# Patient Record
Sex: Male | Born: 1980
Health system: Southern US, Community
[De-identification: ages and names within clinical notes are randomized; demographics above are authoritative.]

## PROBLEM LIST (undated history)

## (undated) DIAGNOSIS — F32A Depression, unspecified: Secondary | ICD-10-CM

## (undated) DIAGNOSIS — D571 Sickle-cell disease without crisis: Secondary | ICD-10-CM

## (undated) DIAGNOSIS — D573 Sickle-cell trait: Secondary | ICD-10-CM

## (undated) DIAGNOSIS — R161 Splenomegaly, not elsewhere classified: Secondary | ICD-10-CM

## (undated) HISTORY — DX: Splenomegaly, not elsewhere classified: R16.1

## (undated) HISTORY — DX: Sickle-cell trait: D57.3

## (undated) HISTORY — DX: Depression, unspecified: F32.A

---

## 2019-07-28 DIAGNOSIS — Z20828 Contact with and (suspected) exposure to other viral communicable diseases: Secondary | ICD-10-CM | POA: Diagnosis not present

## 2019-07-28 DIAGNOSIS — Z03818 Encounter for observation for suspected exposure to other biological agents ruled out: Secondary | ICD-10-CM | POA: Diagnosis not present

## 2019-08-22 DIAGNOSIS — B002 Herpesviral gingivostomatitis and pharyngotonsillitis: Secondary | ICD-10-CM | POA: Diagnosis not present

## 2019-08-23 ENCOUNTER — Other Ambulatory Visit: Payer: Self-pay

## 2019-08-23 ENCOUNTER — Encounter (HOSPITAL_COMMUNITY): Payer: Self-pay

## 2019-08-23 ENCOUNTER — Emergency Department (HOSPITAL_COMMUNITY): Payer: BC Managed Care – PPO

## 2019-08-23 ENCOUNTER — Emergency Department (HOSPITAL_COMMUNITY)
Admission: EM | Admit: 2019-08-23 | Discharge: 2019-08-23 | Disposition: A | Discharge status: Home / Self Care | Payer: BC Managed Care – PPO | Attending: Emergency Medicine | Admitting: Emergency Medicine

## 2019-08-23 DIAGNOSIS — R161 Splenomegaly, not elsewhere classified: Secondary | ICD-10-CM | POA: Insufficient documentation

## 2019-08-23 DIAGNOSIS — R1012 Left upper quadrant pain: Secondary | ICD-10-CM

## 2019-08-23 DIAGNOSIS — J984 Other disorders of lung: Secondary | ICD-10-CM | POA: Diagnosis not present

## 2019-08-23 DIAGNOSIS — Z8616 Personal history of COVID-19: Secondary | ICD-10-CM | POA: Insufficient documentation

## 2019-08-23 DIAGNOSIS — R1011 Right upper quadrant pain: Secondary | ICD-10-CM | POA: Diagnosis not present

## 2019-08-23 DIAGNOSIS — K5900 Constipation, unspecified: Secondary | ICD-10-CM | POA: Diagnosis not present

## 2019-08-23 DIAGNOSIS — R1031 Right lower quadrant pain: Secondary | ICD-10-CM | POA: Diagnosis not present

## 2019-08-23 LAB — URINALYSIS, ROUTINE W REFLEX MICROSCOPIC
Bacteria, UA: NONE SEEN
Bilirubin Urine: NEGATIVE
Glucose, UA: NEGATIVE mg/dL
Ketones, ur: NEGATIVE mg/dL
Leukocytes,Ua: NEGATIVE
Nitrite: NEGATIVE
Protein, ur: 30 mg/dL — AB
Specific Gravity, Urine: 1.011 (ref 1.005–1.030)
pH: 6 (ref 5.0–8.0)

## 2019-08-23 LAB — COMPREHENSIVE METABOLIC PANEL
ALT: 9 U/L (ref 0–44)
AST: 13 U/L — ABNORMAL LOW (ref 15–41)
Albumin: 4.3 g/dL (ref 3.5–5.0)
Alkaline Phosphatase: 39 U/L (ref 38–126)
Anion gap: 9 (ref 5–15)
BUN: 10 mg/dL (ref 6–20)
CO2: 29 mmol/L (ref 22–32)
Calcium: 9.2 mg/dL (ref 8.9–10.3)
Chloride: 101 mmol/L (ref 98–111)
Creatinine, Ser: 0.97 mg/dL (ref 0.61–1.24)
GFR calc Af Amer: >60 mL/min (ref >60)
GFR calc non Af Amer: >60 mL/min (ref >60)
Glucose, Bld: 103 mg/dL — ABNORMAL HIGH (ref 70–99)
Potassium: 4.1 mmol/L (ref 3.5–5.1)
Sodium: 139 mmol/L (ref 135–145)
Total Bilirubin: 2 mg/dL — ABNORMAL HIGH (ref 0.3–1.2)
Total Protein: 7.6 g/dL (ref 6.5–8.1)

## 2019-08-23 LAB — CBC
HCT: 36 % — ABNORMAL LOW (ref 39.0–52.0)
Hemoglobin: 12.6 g/dL — ABNORMAL LOW (ref 13.0–17.0)
MCH: 28.2 pg (ref 26.0–34.0)
MCHC: 35 g/dL (ref 30.0–36.0)
MCV: 80.5 fL (ref 80.0–100.0)
Platelets: 152 10*3/uL (ref 150–400)
RBC: 4.47 MIL/uL (ref 4.22–5.81)
RDW: 13.2 % (ref 11.5–15.5)
WBC: 9 10*3/uL (ref 4.0–10.5)
nRBC: 0 % (ref 0.0–0.2)

## 2019-08-23 LAB — LIPASE, BLOOD: Lipase: 24 U/L (ref 11–51)

## 2019-08-23 MED ORDER — IOHEXOL 300 MG/ML  SOLN
100.0000 mL | Freq: Once | INTRAMUSCULAR | Status: AC | PRN
  Administered 2019-08-23: 100 mL via INTRAVENOUS

## 2019-08-23 MED ORDER — HYDROCODONE-ACETAMINOPHEN 5-325 MG PO TABS: 1.0000 | ORAL_TABLET | Freq: Four times a day (QID) | ORAL | 0 refills | Status: DC | PRN

## 2019-08-23 MED ORDER — MORPHINE SULFATE (PF) 4 MG/ML IV SOLN
4.0000 mg | Freq: Once | INTRAVENOUS | Status: AC
  Administered 2019-08-23: 4 mg via INTRAVENOUS
  Filled 2019-08-23: qty 1

## 2019-08-23 MED ORDER — SODIUM CHLORIDE 0.9% FLUSH
3.0000 mL | Freq: Once | INTRAVENOUS | Status: AC
  Administered 2019-08-23: 3 mL via INTRAVENOUS

## 2019-08-23 MED ORDER — SODIUM CHLORIDE 0.9 % IV BOLUS
1000.0000 mL | Freq: Once | INTRAVENOUS | Status: AC
  Administered 2019-08-23: 1000 mL via INTRAVENOUS

## 2019-08-23 NOTE — Discharge Instructions (Addendum)
Please follow up with your PCP regarding your ED visit today. Please also follow up with hematology/oncology Dr. Myrle Sheng for further evaluation of your spleen.   Pick up pain medication and take as needed.   Return to the ED IMMEDIATELY for any worsening symptoms including worsening pain, fevers > 100.4, chills, or any other new/concerning symptoms.

## 2019-08-23 NOTE — ED Triage Notes (Signed)
Pt thinks spleen is enlarged. Since Thursday having pains on left side. Denies n/v/d. Reports had issues with spleen couple years ago.

## 2019-08-23 NOTE — ED Provider Notes (Signed)
[facility] DEPT Provider Note   CSN: [id number] Arrival date & time: 08/23/19  1155     History Chief Complaint  Patient presents with  . Abdominal Pain    Troy Schwartz is a 39 y.o. male who presents to the ED today with complaint of gradual onset, constant, worsening, sharp, 10/10, LUQ abdominal pain x 2 days. Pt reports hx of enlarged spleen 20 years ago in [place] and states this feels similar. Pt states that he will have intermittent pain to his spleen over the years which is typically treated with "antibiotics and pain medication." Pt states he was being followed by a hematologist in [place] however moved to this area in 2009 and has not followed up with anyone since then. Pt reports hx of sickle cell trait which they believed to be the cause. Pt states he has gone to urgent cares throughout the years who treat him. He states he will typically need to have some sort of illness which triggers this - he states he tested positive for COVID 19 at the beginning of this month at a local [facility] and believes this was the trigger. He also mentions he has not had a BM in the past 3-4 days which is not typical for him. Denies any abdominal surgeries. Pt denies any recent sick contacts. Denies heavy alcohol use. Denies fevers, chills, chest pain, SOB, nausea, vomiting, diarrhea, urinary sx, testicular pain/swelling, or any other associated symptoms.   No records in our system for patient including any urgent care visits or [facility] visits for COVID testing.   The history is provided by the patient and medical records.       History reviewed. No pertinent past medical history.  There are no problems to display for this patient.   History reviewed. No pertinent surgical history.     No family history on file.  Social History   Tobacco Use  . Smoking status: Former Research scientist (life sciences)  . Smokeless tobacco: Never Used  Substance Use Topics  . Alcohol use: Yes  . Drug  use: Not on file    Home Medications Prior to Admission medications   Medication Sig Start Date End Date Taking? Authorizing Provider  HYDROcodone-acetaminophen (NORCO/VICODIN) 5-325 MG tablet Take 1 tablet by mouth every 6 (six) hours as needed for severe pain. 08/23/19   Eustaquio Maize, PA-C    Allergies    Patient has no known allergies.  Review of Systems   Review of Systems  Constitutional: Negative for chills and fever.  Gastrointestinal: Positive for abdominal pain and constipation. Negative for diarrhea, nausea and vomiting.  All other systems reviewed and are negative.   Physical Exam Updated Vital Signs BP (!) 104/56 (BP Location: Right Arm)   Pulse 88   Temp 98.2 F (36.8 C) (Oral)   Resp 17   SpO2 100%   Physical Exam Vitals and nursing note reviewed.  Constitutional:      Appearance: He is not ill-appearing or diaphoretic.  HENT:     Head: Normocephalic and atraumatic.  Eyes:     Conjunctiva/sclera: Conjunctivae normal.  Cardiovascular:     Rate and Rhythm: Normal rate and regular rhythm.     Heart sounds: Normal heart sounds.  Pulmonary:     Effort: Pulmonary effort is normal.     Breath sounds: Normal breath sounds. No wheezing, rhonchi or rales.  Abdominal:     General: Abdomen is flat.     Palpations: Abdomen is soft.  Tenderness: There is abdominal tenderness in the left upper quadrant. There is guarding (voluntary). There is no right CVA tenderness, left CVA tenderness or rebound.  Musculoskeletal:     Cervical back: Neck supple.  Skin:    General: Skin is warm and dry.  Neurological:     Mental Status: He is alert.     ED Results / Procedures / Treatments   Labs (all labs ordered are listed, but only abnormal results are displayed) Labs Reviewed  COMPREHENSIVE METABOLIC PANEL - Abnormal; Notable for the following components:      Result Value   Glucose, Bld 103 (*)    AST 13 (*)    Total Bilirubin 2.0 (*)    All other components  within normal limits  CBC - Abnormal; Notable for the following components:   Hemoglobin 12.6 (*)    HCT 36.0 (*)    All other components within normal limits  URINALYSIS, ROUTINE W REFLEX MICROSCOPIC - Abnormal; Notable for the following components:   Hgb urine dipstick SMALL (*)    Protein, ur 30 (*)    All other components within normal limits  LIPASE, BLOOD  DIFFERENTIAL    EKG None  Radiology DG Chest 2 View  Result Date: 08/23/2019 CLINICAL DATA:  Left upper quadrant pain for 2 days. EXAM: CHEST - 2 VIEW COMPARISON:  None. FINDINGS: The heart size and mediastinal contours are within normal limits. Airspace disease is seen in the posterior left lower lobe, suspicious for pneumonia. Right lung is clear. No evidence of pleural effusion. IMPRESSION: Posterior left lower lobe airspace disease, suspicious for pneumonia. Continued radiographic follow-up is recommended to confirm resolution. Electronically Signed   By: Danae Orleans M.D.   On: 08/23/2019 18:11   CT Abdomen Pelvis W Contrast  Result Date: 08/23/2019 CLINICAL DATA:  Splenomegaly. Left upper quadrant abdominal pain x2 days. EXAM: CT ABDOMEN AND PELVIS WITH CONTRAST TECHNIQUE: Multidetector CT imaging of the abdomen and pelvis was performed using the standard protocol following bolus administration of intravenous contrast. CONTRAST:  OMNIPAQUE IOHEXOL 300 MG/ML  SOLN COMPARISON:  None. FINDINGS: Lower chest: There is extensive consolidation involving the left lower lobe. There is a small left-sided pleural effusion.There is a trace right-sided pleural effusion. The heart size is normal. Hepatobiliary: The liver is normal. Normal gallbladder.There is no biliary ductal dilation. Pancreas: Normal contours without ductal dilatation. No peripancreatic fluid collection. Spleen: There is significant splenomegaly with the spleen measuring nearly 18 cm craniocaudad. Adrenals/Urinary Tract: --Adrenal glands: Unremarkable. --Right  kidney/ureter: No hydronephrosis or radiopaque kidney stones. --Left kidney/ureter: No hydronephrosis or radiopaque kidney stones. --Urinary bladder: Unremarkable. Stomach/Bowel: --Stomach/Duodenum: No hiatal hernia or other gastric abnormality. Normal duodenal course and caliber. --Small bowel: Unremarkable. --Colon: Unremarkable. --Appendix: Normal. Vascular/Lymphatic: Normal course and caliber of the major abdominal vessels. --No retroperitoneal lymphadenopathy. --No mesenteric lymphadenopathy. --No pelvic or inguinal lymphadenopathy. Reproductive: Unremarkable Other: No ascites or free air. The abdominal wall is normal. Musculoskeletal. No acute displaced fractures. IMPRESSION: 1. Left lower lobe consolidation concerning for pneumonia in the appropriate clinical setting. There is a small left-sided pleural effusion and trace right-sided pleural effusion. 2. Significant splenomegaly. 3. No acute abdominopelvic abnormality. Electronically Signed   By: Katherine Mantle M.D.   On: 08/23/2019 17:32    Procedures Procedures (including critical care time)  Medications Ordered in ED Medications  sodium chloride flush (NS) 0.9 % injection 3 mL (3 mLs Intravenous Given 08/23/19 1730)  sodium chloride 0.9 % bolus 1,000 mL (0 mLs Intravenous  Stopped 08/23/19 1904)  morphine 4 MG/ML injection 4 mg (4 mg Intravenous Given 08/23/19 1730)  iohexol (OMNIPAQUE) 300 MG/ML solution 100 mL (100 mLs Intravenous Contrast Given 08/23/19 1712)    ED Course  I have reviewed the triage vital signs and the nursing notes.  Pertinent labs & imaging results that were available during my care of the patient were reviewed by me and considered in my medical decision making (see chart for details).    MDM Rules/Calculators/A&P                      39 year old male who presents to the ED today plaint of left upper quadrant pain for the past 2 days, reports history of "enlarged spleen" and states that it will act up from time  to time.  He states he saw a hematologist in [place] however has been living here since 2009 and has not followed up with anyone.  Reports he typically receives "antibiotics and pain medication" relieves his symptoms.  He states typically have a flareup after some sort of infection, reports he tested positive for COVID-19 at the beginning of May.  Without any respiratory complaints at this time, no chest pain.  On arrival to the ED patient is afebrile, nontachycardic and nontachypneic.  He does have left upper quadrant tenderness palpation with voluntary guarding.  Virtually he did not have any records in our system for patient and it does not appear he is followed up with a primary care physician in quite some time nor hematologist.  Patient does not seem to have true asplenia is not on prophylactic antibiotics.  He does state that he has sickle cell trait.  Work-up for abdominal pain at this time with labs and obtain CT scan as we do not have one in our system.  IV fluids and pain medication provided.   CBC without leukocytosis.  Hemoglobin 12.6.  Platelets 152.  CMP with a glucose of 103 and a T bili of 2.0, no other LFT elevation. No other electrolyte abnormalities.  Lipase 24.  UA without infection.  CT scan IMPRESSION:  1. Left lower lobe consolidation concerning for pneumonia in the  appropriate clinical setting. There is a small left-sided pleural  effusion and trace right-sided pleural effusion.  2. Significant splenomegaly.  3. No acute abdominopelvic abnormality.   Have added on chest x-ray to evaluate for lower lobe consolidation. IMPRESSION:  Posterior left lower lobe airspace disease, suspicious for  pneumonia. Continued radiographic follow-up is recommended to  confirm resolution.   On reevaluation patient resting comfortably.  He is again denying any respiratory complaints.  His lungs are clear to auscultation bilaterally.  Do not feel he needs antibiotics at this time.   Suspect this is a incidental finding from his COVID-19 infection. Have discussed need for antibiotics for pt's splenomegaly with attending physician Dr. Freida Busman who does not believe pt requires antibiotics at this time. Will discharge with short course of pain medication and have pt follow up with heme onc. Pt does report he has a PCP but only recently started going to them and has not discussed history of splenomegaly. Have advised follow up. Will also give pt info for Heme Onc for follow up. Strict return precautions discussed with pt - he is in agreement with plan and stable for discharge home.   This note was prepared using Dragon voice recognition software and may include unintentional dictation errors due to the inherent limitations of voice recognition software.  Final Clinical Impression(s) / ED Diagnoses Final diagnoses:  Splenomegaly  Left upper quadrant abdominal pain    Rx / DC Orders ED Discharge Orders         Ordered    HYDROcodone-acetaminophen (NORCO/VICODIN) 5-325 MG tablet  Every 6 hours PRN     08/23/19 1908           Discharge Instructions     Please follow up with your PCP regarding your ED visit today. Please also follow up with hematology/oncology Dr. Myrle Sheng for further evaluation of your spleen.   Pick up pain medication and take as needed.   Return to the ED IMMEDIATELY for any worsening symptoms including worsening pain, fevers > 100.4, chills, or any other new/concerning symptoms.        Tanda Rockers, PA-C 08/23/19 1910    Lorre Nick, MD 08/24/19 [phone]

## 2019-08-26 ENCOUNTER — Telehealth: Payer: Self-pay | Admitting: Hematology and Oncology

## 2019-08-26 DIAGNOSIS — R918 Other nonspecific abnormal finding of lung field: Secondary | ICD-10-CM | POA: Diagnosis not present

## 2019-08-26 DIAGNOSIS — R161 Splenomegaly, not elsewhere classified: Secondary | ICD-10-CM | POA: Diagnosis not present

## 2019-08-26 NOTE — Telephone Encounter (Signed)
I received a msg from Dr. Truett Perna to schedule a new pt appt for splenomegaly. Troy Schwartz has been cld and scheduled to see Troy Schwartz on 6/7 at 10am. Pt aware to arrive 15 minutes early.

## 2019-09-01 ENCOUNTER — Ambulatory Visit (HOSPITAL_COMMUNITY)
Admission: RE | Admit: 2019-09-01 | Discharge: 2019-09-01 | Disposition: A | Discharge status: Home / Self Care | Payer: BC Managed Care – PPO | Source: Ambulatory Visit | Attending: Hematology and Oncology | Admitting: Hematology and Oncology

## 2019-09-01 ENCOUNTER — Other Ambulatory Visit: Payer: Self-pay

## 2019-09-01 ENCOUNTER — Encounter: Payer: Self-pay | Admitting: Hematology and Oncology

## 2019-09-01 ENCOUNTER — Inpatient Hospital Stay: Payer: BC Managed Care – PPO

## 2019-09-01 ENCOUNTER — Other Ambulatory Visit: Payer: Self-pay | Admitting: Hematology and Oncology

## 2019-09-01 ENCOUNTER — Inpatient Hospital Stay: Payer: BC Managed Care – PPO | Attending: Hematology and Oncology | Admitting: Hematology and Oncology

## 2019-09-01 VITALS — BP 98/66 | HR 90 | Temp 98.6°F | Resp 18 | Ht 73.0 in | Wt 138.7 lb

## 2019-09-01 DIAGNOSIS — R0602 Shortness of breath: Secondary | ICD-10-CM

## 2019-09-01 DIAGNOSIS — Z0000 Encounter for general adult medical examination without abnormal findings: Secondary | ICD-10-CM

## 2019-09-01 DIAGNOSIS — R161 Splenomegaly, not elsewhere classified: Secondary | ICD-10-CM | POA: Diagnosis not present

## 2019-09-01 DIAGNOSIS — R05 Cough: Secondary | ICD-10-CM | POA: Diagnosis not present

## 2019-09-01 DIAGNOSIS — D573 Sickle-cell trait: Secondary | ICD-10-CM | POA: Diagnosis not present

## 2019-09-01 DIAGNOSIS — R918 Other nonspecific abnormal finding of lung field: Secondary | ICD-10-CM | POA: Diagnosis not present

## 2019-09-01 LAB — CBC WITH DIFFERENTIAL (CANCER CENTER ONLY)
Abs Immature Granulocytes: 0.03 10*3/uL (ref 0.00–0.07)
Basophils Absolute: 0.1 10*3/uL (ref 0.0–0.1)
Basophils Relative: 1 %
Eosinophils Absolute: 0.7 10*3/uL — ABNORMAL HIGH (ref 0.0–0.5)
Eosinophils Relative: 7 %
HCT: 33.5 % — ABNORMAL LOW (ref 39.0–52.0)
Hemoglobin: 11.8 g/dL — ABNORMAL LOW (ref 13.0–17.0)
Immature Granulocytes: 0 %
Lymphocytes Relative: 20 %
Lymphs Abs: 1.8 10*3/uL (ref 0.7–4.0)
MCH: 27 pg (ref 26.0–34.0)
MCHC: 35.2 g/dL (ref 30.0–36.0)
MCV: 76.7 fL — ABNORMAL LOW (ref 80.0–100.0)
Monocytes Absolute: 0.9 10*3/uL (ref 0.1–1.0)
Monocytes Relative: 10 %
Neutro Abs: 5.7 10*3/uL (ref 1.7–7.7)
Neutrophils Relative %: 62 %
Platelet Count: 247 10*3/uL (ref 150–400)
RBC: 4.37 MIL/uL (ref 4.22–5.81)
RDW: 15.1 % (ref 11.5–15.5)
WBC Count: 9.2 10*3/uL (ref 4.0–10.5)
nRBC: 0 % (ref 0.0–0.2)

## 2019-09-01 LAB — LACTATE DEHYDROGENASE: LDH: 216 U/L — ABNORMAL HIGH (ref 98–192)

## 2019-09-01 LAB — CMP (CANCER CENTER ONLY)
ALT: 11 U/L (ref 0–44)
AST: 15 U/L (ref 15–41)
Albumin: 4 g/dL (ref 3.5–5.0)
Alkaline Phosphatase: 47 U/L (ref 38–126)
Anion gap: 11 (ref 5–15)
BUN: 12 mg/dL (ref 6–20)
CO2: 27 mmol/L (ref 22–32)
Calcium: 9.6 mg/dL (ref 8.9–10.3)
Chloride: 99 mmol/L (ref 98–111)
Creatinine: 0.99 mg/dL (ref 0.61–1.24)
GFR, Est AFR Am: >60 mL/min (ref >60)
GFR, Estimated: >60 mL/min (ref >60)
Glucose, Bld: 75 mg/dL (ref 70–99)
Potassium: 4.2 mmol/L (ref 3.5–5.1)
Sodium: 137 mmol/L (ref 135–145)
Total Bilirubin: 1.6 mg/dL — ABNORMAL HIGH (ref 0.3–1.2)
Total Protein: 7.9 g/dL (ref 6.5–8.1)

## 2019-09-01 LAB — SEDIMENTATION RATE: Sed Rate: 2 mm/hr (ref 0–16)

## 2019-09-01 LAB — HEPATITIS B SURFACE ANTIGEN: Hepatitis B Surface Ag: NONREACTIVE

## 2019-09-01 LAB — HEPATITIS C ANTIBODY: HCV Ab: NONREACTIVE

## 2019-09-01 LAB — SAVE SMEAR(SSMR), FOR PROVIDER SLIDE REVIEW

## 2019-09-01 LAB — HIV ANTIBODY (ROUTINE TESTING W REFLEX): HIV Screen 4th Generation wRfx: NONREACTIVE

## 2019-09-01 MED ORDER — LEVOFLOXACIN 750 MG PO TABS
750.0000 mg | ORAL_TABLET | Freq: Every day | ORAL | 0 refills | Status: DC
Start: 2019-09-01 — End: 2019-12-03

## 2019-09-01 MED ORDER — IOHEXOL 350 MG/ML SOLN
100.0000 mL | Freq: Once | INTRAVENOUS | Status: AC | PRN
  Administered 2019-09-01: 100 mL via INTRAVENOUS

## 2019-09-01 MED ORDER — SODIUM CHLORIDE (PF) 0.9 % IJ SOLN
INTRAMUSCULAR | Status: AC
  Filled 2019-09-01: qty 50

## 2019-09-01 NOTE — Progress Notes (Signed)
[place] [phone]   Fax:([phone]) [place] NOTE  Patient Care Team: Trey Sailors, PA as PCP - General (Physician Assistant)  Hematological/Oncological History # Splenomegaly 1) 08/23/2019: patient presented to the emergency department with 1-2 days of abdominal pain. CT abdomen revealed an enlarged spleen at 18 cm. No other adenopathy noted. No hepatic abnormalities. 2) 09/01/2019: establish care with Dr. Lorenso Courier    CHIEF COMPLAINTS/PURPOSE OF CONSULTATION:  "Splenomegaly "  HISTORY OF PRESENTING ILLNESS:  Troy Schwartz 39 y.o. male with medical history significant for sickle cell trait and splenomegaly who presents for evaluation of new onset abdominal pain and was found to have a spleen enlarged at 18cm.   On review of the previous records Troy Schwartz initially presented to the emergency department on Aug 23, 2019.  He noted that he was having constant worsening 10 of the 10 left upper quadrant abdominal pain for the last 2 days.  He reported that he had similar symptoms 20 years ago in [place] and was told he had enlarged spleen at that time.  On exam today Troy Schwartz notes that his abdominal pain is slightly improved from his visit to the emergency department on 08/23/2019.  He notes that he has chronic issues with an enlarged spleen and that happens every 2 to 3 years.  He notes that it tends to be very closely connected to infections or other illnesses.  He also endorses when he was younger having taken a long flight to [place] by the end his "spleen was huge".  He notes that it only takes about 2 weeks for his spleen to return to normal size after an episode of painful enlargement.  On further discussion Troy Schwartz notes that he is having a new symptom of marked shortness of breath.  He notes that he is having difficulty with breathing and has been having issues with cough.  On the chest x-ray he had performed in the  emergency department he was shown to have pneumonia, however no antibiotic treatment was started at that time.  Of note he was infected with Covid in late April 2021 and has been having some consistent issues since that time with shortness of breath.  He also notes having issues of sweats, chills, but no frank fever, nausea, vomiting or diarrhea.  He notes that with episodes of his splenomegaly he does get "trapped on my back" and has long periods of immobility.  Otherwise he does note recent concerning weight loss with 17 pounds lost since his Covid infection in April.  He reports that he was a smoker previously but quit with that infection.  He denies any alcohol use or recreational drug use.  Otherwise he denies having any nausea, vomiting, diarrhea, constipation or other abdominal symptoms other than discomfort.  A full 10 point ROS is listed below.  MEDICAL HISTORY:  Past Medical History:  Diagnosis Date  . Sickle cell trait ([place])   . Splenomegaly     SURGICAL HISTORY: History reviewed. No pertinent surgical history.  SOCIAL HISTORY: Social History   Socioeconomic History  . Marital status: Single    Spouse name: Not on file  . Number of children: Not on file  . Years of education: Not on file  . Highest education level: Not on file  Occupational History  . Not on file  Tobacco Use  . Smoking status: Former Research scientist (life sciences)  . Smokeless tobacco: Never Used  Substance and Sexual Activity  . Alcohol  use: Yes  . Drug use: Not on file  . Sexual activity: Not on file  Other Topics Concern  . Not on file  Social History Narrative  . Not on file   Social Determinants of Health   Financial Resource Strain:   . Difficulty of Paying Living Expenses:   Food Insecurity:   . Worried About Charity fundraiser in the Last Year:   . Arboriculturist in the Last Year:   Transportation Needs:   . Film/video editor (Medical):   Marland Kitchen Lack of Transportation (Non-Medical):   Physical Activity:     . Days of Exercise per Week:   . Minutes of Exercise per Session:   Stress:   . Feeling of Stress :   Social Connections:   . Frequency of Communication with Friends and Family:   . Frequency of Social Gatherings with Friends and Family:   . Attends Religious Services:   . Active Member of Clubs or Organizations:   . Attends Archivist Meetings:   Marland Kitchen Marital Status:   Intimate Partner Violence:   . Fear of Current or Ex-Partner:   . Emotionally Abused:   Marland Kitchen Physically Abused:   . Sexually Abused:     FAMILY HISTORY: Family History  Problem Relation Age of Onset  . Healthy Mother   . Sickle cell trait Mother   . Healthy Father   . Sickle cell trait Father   . Healthy Sister   . Healthy Brother     ALLERGIES:  has No Known Allergies.  MEDICATIONS:  Current Outpatient Medications  Medication Sig Dispense Refill  . levofloxacin (LEVAQUIN) 750 MG tablet Take 1 tablet (750 mg total) by mouth daily. 7 tablet 0   No current facility-administered medications for this visit.    REVIEW OF SYSTEMS:   Constitutional: ( - ) fevers, ( - )  chills , ( + ) night sweats Eyes: ( - ) blurriness of vision, ( - ) double vision, ( - ) watery eyes Ears, nose, mouth, throat, and face: ( - ) mucositis, ( - ) sore throat Respiratory: ( - ) cough, ( + ) dyspnea, ( - ) wheezes Cardiovascular: ( - ) palpitation, ( - ) chest discomfort, ( - ) lower extremity swelling Gastrointestinal:  ( - ) nausea, ( - ) heartburn, ( - ) change in bowel habits Skin: ( - ) abnormal skin rashes Lymphatics: ( - ) new lymphadenopathy, ( - ) easy bruising Neurological: ( - ) numbness, ( - ) tingling, ( - ) new weaknesses Behavioral/Psych: ( - ) mood change, ( - ) new changes  All other systems were reviewed with the patient and are negative.  PHYSICAL EXAMINATION: ECOG PERFORMANCE STATUS: 1 - Symptomatic but completely ambulatory  Vitals:   09/01/19 1021  BP: 98/66  Pulse: 90  Resp: 18  Temp: 98.6  F (37 C)  SpO2: 100%   Filed Weights   09/01/19 1021  Weight: 138 lb 11.2 oz (62.9 kg)    GENERAL: well appearing thin African American male in NAD  SKIN: skin color, texture, turgor are normal, no rashes or significant lesions EYES: conjunctiva are pink and non-injected, sclera clear LUNGS: clear to auscultation and percussion with normal breathing effort HEART: regular rate & rhythm and no murmurs and no lower extremity edema ABDOMEN: soft, non-tender, non-distended, normal bowel sounds. Spleen at least 4 cm below the left ribs.  Musculoskeletal: no cyanosis of digits and no clubbing  PSYCH: alert & oriented x 3, fluent speech NEURO: no focal motor/sensory deficits  LABORATORY DATA:  I have reviewed the data as listed CBC Latest Ref Rng & Units 09/01/2019 08/23/2019  WBC 4.0 - 10.5 K/uL 9.2 9.0  Hemoglobin 13.0 - 17.0 g/dL 11.8(L) 12.6(L)  Hematocrit 39.0 - 52.0 % 33.5(L) 36.0(L)  Platelets 150 - 400 K/uL 247 152    CMP Latest Ref Rng & Units 09/01/2019 08/23/2019  Glucose 70 - 99 mg/dL 75 103(H)  BUN 6 - 20 mg/dL 12 10  Creatinine 0.61 - 1.24 mg/dL 0.99 0.97  Sodium 135 - 145 mmol/L 137 139  Potassium 3.5 - 5.1 mmol/L 4.2 4.1  Chloride 98 - 111 mmol/L 99 101  CO2 22 - 32 mmol/L 27 29  Calcium 8.9 - 10.3 mg/dL 9.6 9.2  Total Protein 6.5 - 8.1 g/dL 7.9 7.6  Total Bilirubin 0.3 - 1.2 mg/dL 1.6(H) 2.0(H)  Alkaline Phos 38 - 126 U/L 47 39  AST 15 - 41 U/L 15 13(L)  ALT 0 - 44 U/L 11 9    RADIOGRAPHIC STUDIES: I have personally reviewed the radiological images as listed and agreed with the findings in the report: marked splenomegaly with no other clear adenopathy.   DG Chest 2 View  Result Date: 08/23/2019 CLINICAL DATA:  Left upper quadrant pain for 2 days. EXAM: CHEST - 2 VIEW COMPARISON:  None. FINDINGS: The heart size and mediastinal contours are within normal limits. Airspace disease is seen in the posterior left lower lobe, suspicious for pneumonia. Right lung is clear.  No evidence of pleural effusion. IMPRESSION: Posterior left lower lobe airspace disease, suspicious for pneumonia. Continued radiographic follow-up is recommended to confirm resolution. Electronically Signed   By: Marlaine Hind M.D.   On: 08/23/2019 18:11   CT Abdomen Pelvis W Contrast  Result Date: 08/23/2019 CLINICAL DATA:  Splenomegaly. Left upper quadrant abdominal pain x2 days. EXAM: CT ABDOMEN AND PELVIS WITH CONTRAST TECHNIQUE: Multidetector CT imaging of the abdomen and pelvis was performed using the standard protocol following bolus administration of intravenous contrast. CONTRAST:  137m OMNIPAQUE IOHEXOL 300 MG/ML  SOLN COMPARISON:  None. FINDINGS: Lower chest: There is extensive consolidation involving the left lower lobe. There is a small left-sided pleural effusion.There is a trace right-sided pleural effusion. The heart size is normal. Hepatobiliary: The liver is normal. Normal gallbladder.There is no biliary ductal dilation. Pancreas: Normal contours without ductal dilatation. No peripancreatic fluid collection. Spleen: There is significant splenomegaly with the spleen measuring nearly 18 cm craniocaudad. Adrenals/Urinary Tract: --Adrenal glands: Unremarkable. --Right kidney/ureter: No hydronephrosis or radiopaque kidney stones. --Left kidney/ureter: No hydronephrosis or radiopaque kidney stones. --Urinary bladder: Unremarkable. Stomach/Bowel: --Stomach/Duodenum: No hiatal hernia or other gastric abnormality. Normal duodenal course and caliber. --Small bowel: Unremarkable. --Colon: Unremarkable. --Appendix: Normal. Vascular/Lymphatic: Normal course and caliber of the major abdominal vessels. --No retroperitoneal lymphadenopathy. --No mesenteric lymphadenopathy. --No pelvic or inguinal lymphadenopathy. Reproductive: Unremarkable Other: No ascites or free air. The abdominal wall is normal. Musculoskeletal. No acute displaced fractures. IMPRESSION: 1. Left lower lobe consolidation concerning for  pneumonia in the appropriate clinical setting. There is a small left-sided pleural effusion and trace right-sided pleural effusion. 2. Significant splenomegaly. 3. No acute abdominopelvic abnormality. Electronically Signed   By: CConstance HolsterM.D.   On: 08/23/2019 17:32    ASSESSMENT & PLAN AErie Radu38y.o. male with medical history significant for sickle cell trait and splenomegaly who presents for evaluation of new onset abdominal pain and was found to have  a spleen enlarged at 18cm.  After review of the imaging, review the labs, discussion with the patient his findings are most consistent with a splenomegaly provoked by viral infection.  His CT scan showed he had some degree of pneumonia which may have triggered this episode of splenomegaly in the setting of sickle cell trait.  The patient notes that he has had a history of splenomegaly dating back at least 20 years and that he was previously followed in [place], [place] where it was determined that sickle cell trait was the cause.  This is an uncommon scenario, though plausible.  Today we will order additional labs to rule out common etiologies as well as a hemoglobinopathy evaluation in order to assure that the patient does indeed have sickle cell trait.  At this time I would recommend routine monitoring in our clinic with prompt return in the event he were to develop any worsening symptoms such as fevers, chills, sweats or abdominal pain.  Troy Schwartz voiced his understanding of these findings and plan moving forward.  # Splenomegaly --CT imaging confirms that the patient has an enlarged spleen, matching with his current symptoms. --patient notes he had episodes of splenomegaly and abdominal pain previously in [place] state. It was attributed to Sickle cell trait at that time. -- splenomegaly and splenic infarcts are known to occur in individuals with sickle cell trait, though this is not common. --will perform additional work up to include  ESR, LDH, flow cytometry, assessment for HIV, Hep B, and Hep C. --CBC and CMP to assure no change since his ED visit.  --will order Hgb electrophoresis to confirm the diagnosis of sickle cell trait --with his history of weight loss, sweats, and chills there is certainly concern for lymphoma, though this is made less likely by the chronicity of his splenomegaly (20+ years). Low threshold to consider bone marrow biopsy pending the results of the above labs.  --if the cause is thought to be recurrent flares with sickle cell trait, consider splenectomy to relieve his symptoms.  --RTC in 3 months  #Shortness of Breath #Pneumonia on Chest X-Ray --patient's findings are concerning for left lower lobe pneumonia. He has cough but no fever or leukocytosis --recommend brief course of antibiotics with PO levaquin 767m qd x 7 days. --patient's shortness of breath is disproportionate to his findings on chest x-ray. Recommend CT PE study to r/o PE. Additionally this will help r/o any lymphadenopathy in the chest as part of the splenomegaly workup.  --strict return precautions for worsening chest pain, shortness of breath.  #Health Maintenance --patient is requesting a new PCP. [facility]  --patient is underweight, recent weight loss with COVID in April 2021.  --no other chronic conditions known at this time.   Orders Placed This Encounter  Procedures  . CT ANGIO CHEST PE W OR WO CONTRAST    Standing Status:   Future    Standing Expiration Date:   08/31/2020    Order Specific Question:   ** REASON FOR EXAM (FREE TEXT)    Answer:   Chest pain/shortness of breath    Order Specific Question:   If indicated for the ordered procedure, I authorize the administration of contrast media per Radiology protocol    Answer:   Yes    Order Specific Question:   [facility]    [facility]   Order Specific Question:    Radiology Contrast  Protocol - do NOT remove file path    Answer:   [email or web address]  . CBC with Differential ([facility] Only)    Standing Status:   Future    Number of Occurrences:   1    Standing Expiration Date:   08/31/2020  . Save Smear (SS[facility])    Standing Status:   Future    Number of Occurrences:   1    Standing Expiration Date:   08/31/2020  . CMP ([place] only)    Standing Status:   Future    Number of Occurrences:   1    Standing Expiration Date:   08/31/2020  . Lactate dehydrogenase (LDH)    Standing Status:   Future    Number of Occurrences:   1    Standing Expiration Date:   08/31/2020  . Flow Cytometry    Standing Status:   Future    Number of Occurrences:   1    Standing Expiration Date:   08/31/2020  . Sedimentation rate    Standing Status:   Future    Number of Occurrences:   1    Standing Expiration Date:   08/31/2020  . HIV antibody (with reflex)    Standing Status:   Future    Number of Occurrences:   1    Standing Expiration Date:   08/31/2020  . Hepatitis B surface antigen    Standing Status:   Future    Number of Occurrences:   1    Standing Expiration Date:   08/31/2020  . Hepatitis C antibody    Standing Status:   Future    Number of Occurrences:   1    Standing Expiration Date:   08/31/2020  . Hgb Fractionation Cascade    Standing Status:   Future    Number of Occurrences:   1    Standing Expiration Date:   08/31/2020  . QuantiFERON-TB Gold Plus    All questions were answered. The patient knows to call the clinic with any problems, questions or concerns.  A total of more than 60 minutes were spent on this encounter and over half of that time was spent on counseling and coordination of care as outlined above.   Ledell Peoples, MD Department of Hematology/Oncology [place] at [facility] Phone: [phone] Pager: [phone] Email: [email or web address]  09/01/2019 1:17 PM

## 2019-09-03 ENCOUNTER — Telehealth: Payer: Self-pay | Admitting: *Deleted

## 2019-09-03 LAB — QUANTIFERON-TB GOLD PLUS (RQFGPL)
QuantiFERON Mitogen Value: 3.15 IU/mL
QuantiFERON Nil Value: 0 IU/mL
QuantiFERON TB1 Ag Value: 0 IU/mL
QuantiFERON TB2 Ag Value: 0 IU/mL

## 2019-09-03 LAB — QUANTIFERON-TB GOLD PLUS: QuantiFERON-TB Gold Plus: NEGATIVE

## 2019-09-03 LAB — SURGICAL PATHOLOGY

## 2019-09-03 NOTE — Telephone Encounter (Signed)
TCT patient regarding CT Scan results. No answer but was able to leave vm message for pt to return call at his earliest convenience.

## 2019-09-03 NOTE — Telephone Encounter (Signed)
-----   Message from Jaci Standard, MD sent at 09/03/2019 10:25 AM EDT ----- Please call Troy Schwartz to let him know his CT scan showed no blood clot or enlarged lymph nodes. His pneumonia is much more pronounced on CT scan than the X-ray, which is the likely reason his shortness of breath is so persistent. Please assure he is taking the antibiotics we prescribed. Additionally we have no yet found a reason for his large spleen. Much of the blood work is still pending.   Troy Schwartz  ----- Message ----- From: Leory Plowman, Rad Results In Sent: 09/01/2019   7:29 PM EDT To: Jaci Standard, MD

## 2019-09-04 ENCOUNTER — Telehealth: Payer: Self-pay | Admitting: Hematology and Oncology

## 2019-09-04 LAB — HGB FRAC BY HPLC+SOLUBILITY
Hgb A2: 3.6 % — ABNORMAL HIGH (ref 1.8–3.2)
Hgb A: 0 % — ABNORMAL LOW (ref 96.4–98.8)
Hgb C: 45.5 % — ABNORMAL HIGH
Hgb E: 0 %
Hgb F: 2.6 % — ABNORMAL HIGH (ref 0.0–2.0)
Hgb S: 48.3 % — ABNORMAL HIGH
Hgb Solubility: POSITIVE — AB
Hgb Variant: 0 %

## 2019-09-04 LAB — HGB FRACTIONATION CASCADE

## 2019-09-04 LAB — FLOW CYTOMETRY

## 2019-09-04 NOTE — Telephone Encounter (Signed)
TCT patient informed him per Dr. Leonides Schanz that his CT scan showed no blood clot or enlarged lymph nodes. Advised that he continues and completes his prescribed antibiotics for his pneumonia; and the rest of his lab work is still pending. Patient verbalized understanding.

## 2019-09-04 NOTE — Telephone Encounter (Signed)
Scheduled per los. Called and spoke with patient. Confirmed appt 

## 2019-09-11 ENCOUNTER — Telehealth: Payer: Self-pay | Admitting: *Deleted

## 2019-09-11 ENCOUNTER — Other Ambulatory Visit: Payer: Self-pay | Admitting: Hematology and Oncology

## 2019-09-11 DIAGNOSIS — R161 Splenomegaly, not elsewhere classified: Secondary | ICD-10-CM

## 2019-09-11 DIAGNOSIS — Z0000 Encounter for general adult medical examination without abnormal findings: Secondary | ICD-10-CM

## 2019-09-11 NOTE — Telephone Encounter (Signed)
-----   Message from Jaci Standard, MD sent at 09/09/2019  3:57 PM EDT ----- Please let Mr. 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 This a variant of Sickle Cell Disease which helps to explain his large spleen.   I would continue to recommend he consider splenectomy given the frequent flares of spleen pain. Additionally please assure he is feeling better from this pneumonia after antibiotic treatment.  Azucena Freed  ----- Message ----- From: Leory Plowman, Lab In [place] Sent: 09/01/2019  11:44 AM EDT To: Jaci Standard, MD

## 2019-09-11 NOTE — Telephone Encounter (Signed)
TCT patient regarding lab results.  Spoke with pt. Advised that Dr. 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  This a variant of Sickle Cell disease and this helps explain his large spleen.  Dr. Leonides Schanz recommends that Troy Schwartz consider splenectomy. Pt voiced understanding and had several questions as to the cost of splenectomy, how soon could it be done and how long would he have to be out of work. Advised that these questions would be best answered during the surgery consult. Pt is agreeable to a surgery consultation.    Dr. Leonides Schanz made aware of the need for surgery referral as well as referral for PCP.

## 2019-11-24 ENCOUNTER — Telehealth: Payer: Self-pay | Admitting: *Deleted

## 2019-11-24 DIAGNOSIS — R161 Splenomegaly, not elsewhere classified: Secondary | ICD-10-CM

## 2019-11-24 NOTE — Telephone Encounter (Signed)
Received call from patient this morning. He states he feels like his spleen has enlarged again. He states it started on Saturday evening. He is very uncomfortable and is having pain in his LUQ. He feels SOB due to the increased pressure on his diaphragm.  Denies any recent infections. Denies fever, chills, cough etc.  He states he worked in his yard over the weekend but stayed well hydrated. Spoke to Dr. Leonides Schanz. He recommended a Splenic US. Pt does have an appt with Dr. Leonides Schanz on 12/03/19. He hopes to have [place] results by then. If pain worsens, he recommends pt to go to ED.  Spoke with patient and provided him with # to [facility] to schedule his Splenic US. Order has been placed.  Reminded pt of his appt here on 12/03/19. He voiced understanding to the above directions.

## 2019-11-28 ENCOUNTER — Ambulatory Visit (HOSPITAL_COMMUNITY): Payer: BC Managed Care – PPO

## 2019-11-28 ENCOUNTER — Other Ambulatory Visit: Payer: Self-pay

## 2019-12-02 ENCOUNTER — Other Ambulatory Visit: Payer: Self-pay | Admitting: Hematology and Oncology

## 2019-12-02 DIAGNOSIS — R161 Splenomegaly, not elsewhere classified: Secondary | ICD-10-CM

## 2019-12-03 ENCOUNTER — Inpatient Hospital Stay: Payer: BC Managed Care – PPO

## 2019-12-03 ENCOUNTER — Inpatient Hospital Stay: Payer: BC Managed Care – PPO | Attending: Hematology and Oncology | Admitting: Hematology and Oncology

## 2019-12-03 ENCOUNTER — Other Ambulatory Visit: Payer: Self-pay

## 2019-12-03 VITALS — BP 94/61 | HR 61 | Temp 97.9°F | Resp 17 | Ht 73.0 in | Wt 148.4 lb

## 2019-12-03 DIAGNOSIS — R161 Splenomegaly, not elsewhere classified: Secondary | ICD-10-CM

## 2019-12-03 DIAGNOSIS — D573 Sickle-cell trait: Secondary | ICD-10-CM | POA: Diagnosis not present

## 2019-12-03 DIAGNOSIS — Z8616 Personal history of COVID-19: Secondary | ICD-10-CM | POA: Diagnosis not present

## 2019-12-03 DIAGNOSIS — Z87891 Personal history of nicotine dependence: Secondary | ICD-10-CM | POA: Insufficient documentation

## 2019-12-03 DIAGNOSIS — Z0000 Encounter for general adult medical examination without abnormal findings: Secondary | ICD-10-CM

## 2019-12-03 LAB — CMP (CANCER CENTER ONLY)
ALT: 13 U/L (ref 0–44)
AST: 16 U/L (ref 15–41)
Albumin: 4.1 g/dL (ref 3.5–5.0)
Alkaline Phosphatase: 40 U/L (ref 38–126)
Anion gap: 11 (ref 5–15)
BUN: 8 mg/dL (ref 6–20)
CO2: 25 mmol/L (ref 22–32)
Calcium: 9.3 mg/dL (ref 8.9–10.3)
Chloride: 105 mmol/L (ref 98–111)
Creatinine: 0.99 mg/dL (ref 0.61–1.24)
GFR, Est AFR Am: >60 mL/min (ref >60)
GFR, Estimated: >60 mL/min (ref >60)
Glucose, Bld: 107 mg/dL — ABNORMAL HIGH (ref 70–99)
Potassium: 3.7 mmol/L (ref 3.5–5.1)
Sodium: 141 mmol/L (ref 135–145)
Total Bilirubin: 2 mg/dL — ABNORMAL HIGH (ref 0.3–1.2)
Total Protein: 6.8 g/dL (ref 6.5–8.1)

## 2019-12-03 LAB — CBC WITH DIFFERENTIAL (CANCER CENTER ONLY)
Abs Immature Granulocytes: 0.02 10*3/uL (ref 0.00–0.07)
Basophils Absolute: 0.1 10*3/uL (ref 0.0–0.1)
Basophils Relative: 1 %
Eosinophils Absolute: 0.5 10*3/uL (ref 0.0–0.5)
Eosinophils Relative: 6 %
HCT: 34 % — ABNORMAL LOW (ref 39.0–52.0)
Hemoglobin: 12.3 g/dL — ABNORMAL LOW (ref 13.0–17.0)
Immature Granulocytes: 0 %
Lymphocytes Relative: 27 %
Lymphs Abs: 2.2 10*3/uL (ref 0.7–4.0)
MCH: 28.7 pg (ref 26.0–34.0)
MCHC: 36.2 g/dL — ABNORMAL HIGH (ref 30.0–36.0)
MCV: 79.4 fL — ABNORMAL LOW (ref 80.0–100.0)
Monocytes Absolute: 0.5 10*3/uL (ref 0.1–1.0)
Monocytes Relative: 7 %
Neutro Abs: 4.9 10*3/uL (ref 1.7–7.7)
Neutrophils Relative %: 59 %
Platelet Count: 163 10*3/uL (ref 150–400)
RBC: 4.28 MIL/uL (ref 4.22–5.81)
RDW: 13.6 % (ref 11.5–15.5)
WBC Count: 8.1 10*3/uL (ref 4.0–10.5)
nRBC: 0 % (ref 0.0–0.2)

## 2019-12-03 LAB — LACTATE DEHYDROGENASE: LDH: 181 U/L (ref 98–192)

## 2019-12-03 NOTE — Progress Notes (Signed)
[facility] Telephone:(336) [phone]   Fax:([phone]  PROGRESS NOTE  Patient Care Team: Norm Salt, PA as PCP - General (Physician Assistant)  Hematological/Oncological History # Recurrent Splenomegaly, Likely 2/2 to Sickle Cell Trait 1) 08/23/2019: patient presented to the emergency department with 1-2 days of abdominal pain. CT abdomen revealed an enlarged spleen at 18 cm. No other adenopathy noted. No hepatic abnormalities. 2) 09/01/2019: establish care with Dr. Leonides Schanz    Interval History:  Troy Schwartz 39 y.o. male with medical history significant for recurrent splenomegaly who presents for a follow up visit. The patient's last visit was on 09/01/2019. In the interim since the last visit Troy Schwartz had a flare in his spleen size/pain again on 11/24/2019.  On exam today Troy Schwartz notes that his spleen flared up again on 11/24/2019.  He notes that it occurred after he spent part of the day cutting his lawn in the heat.  He notes that he did take breaks and that his best to stay hydrated.  He notes that today his spleen remains enlarged, but it is not as swollen or painful as previously.  He notes that his pain currently is 0-10 in severity, whereas previously it was 2 out of 10 in severity.  He was scheduled for an abdominal ultrasound which he was unable to perform as he had had a coffee prior to the beginning of the exam.  He notes he has not had any issues with nausea, vomiting, or diarrhea.  He is otherwise been at his baseline level of health since our prior visit.  On further discussion he notes that he may have received the surgical center's call to schedule him, but that he does not routinely answer numbers he does not know because of telemarketer's.  MEDICAL HISTORY:  Past Medical History:  Diagnosis Date  . Sickle cell trait (HCC)   . Splenomegaly     SURGICAL HISTORY: No past surgical history on file.  SOCIAL HISTORY: Social History    Socioeconomic History  . Marital status: Single    Spouse name: Not on file  . Number of children: Not on file  . Years of education: Not on file  . Highest education level: Not on file  Occupational History  . Not on file  Tobacco Use  . Smoking status: Former Games developer  . Smokeless tobacco: Never Used  Vaping Use  . Vaping Use: Never used  Substance and Sexual Activity  . Alcohol use: Yes  . Drug use: Not on file  . Sexual activity: Not on file  Other Topics Concern  . Not on file  Social History Narrative  . Not on file   Social Determinants of Health   Financial Resource Strain:   . Difficulty of Paying Living Expenses: Not on file  Food Insecurity:   . Worried About Programme researcher, broadcasting/film/video in the Last Year: Not on file  . Ran Out of Food in the Last Year: Not on file  Transportation Needs:   . Lack of Transportation (Medical): Not on file  . Lack of Transportation (Non-Medical): Not on file  Physical Activity:   . Days of Exercise per Week: Not on file  . Minutes of Exercise per Session: Not on file  Stress:   . Feeling of Stress : Not on file  Social Connections:   . Frequency of Communication with Friends and Family: Not on file  . Frequency of Social Gatherings with Friends and Family: Not on file  .  Attends Religious Services: Not on file  . Active Member of Clubs or Organizations: Not on file  . Attends Banker Meetings: Not on file  . Marital Status: Not on file  Intimate Partner Violence:   . Fear of Current or Ex-Partner: Not on file  . Emotionally Abused: Not on file  . Physically Abused: Not on file  . Sexually Abused: Not on file    FAMILY HISTORY: Family History  Problem Relation Age of Onset  . Healthy Mother   . Sickle cell trait Mother   . Healthy Father   . Sickle cell trait Father   . Healthy Sister   . Healthy Brother     ALLERGIES:  has No Known Allergies.  MEDICATIONS:  No current outpatient medications on file.    No current facility-administered medications for this visit.    REVIEW OF SYSTEMS:   Constitutional: ( - ) fevers, ( - )  chills , ( - ) night sweats Eyes: ( - ) blurriness of vision, ( - ) double vision, ( - ) watery eyes Ears, nose, mouth, throat, and face: ( - ) mucositis, ( - ) sore throat Respiratory: ( - ) cough, ( - ) dyspnea, ( - ) wheezes Cardiovascular: ( - ) palpitation, ( - ) chest discomfort, ( - ) lower extremity swelling Gastrointestinal:  ( - ) nausea, ( - ) heartburn, ( - ) change in bowel habits Skin: ( - ) abnormal skin rashes Lymphatics: ( - ) new lymphadenopathy, ( - ) easy bruising Neurological: ( - ) numbness, ( - ) tingling, ( - ) new weaknesses Behavioral/Psych: ( - ) mood change, ( - ) new changes  All other systems were reviewed with the patient and are negative.  PHYSICAL EXAMINATION: ECOG PERFORMANCE STATUS: 0 - Asymptomatic  Vitals:   12/03/19 0829  BP: 94/61  Pulse: 61  Resp: 17  Temp: 97.9 F (36.6 C)  SpO2: 100%   Filed Weights   12/03/19 0829  Weight: 148 lb 6.4 oz (67.3 kg)    GENERAL: well appearing middle aged [place] American male. alert, no distress and comfortable SKIN: skin color, texture, turgor are normal, no rashes or significant lesions EYES: conjunctiva are pink and non-injected, sclera clear LUNGS: clear to auscultation and percussion with normal breathing effort HEART: regular rate & rhythm and no murmurs and no lower extremity edema ABDOMEN: soft, non-tender, non-distended, normal bowel sounds. Notable enlarged spleen, at least 2 inches below the costal margin.  Musculoskeletal: no cyanosis of digits and no clubbing  PSYCH: alert & oriented x 3, fluent speech NEURO: no focal motor/sensory deficits  LABORATORY DATA:  I have reviewed the data as listed CBC Latest Ref Rng & Units 12/03/2019 09/01/2019 08/23/2019  WBC 4.0 - 10.5 K/uL 8.1 9.2 9.0  Hemoglobin 13.0 - 17.0 g/dL 12.3(L) 11.8(L) 12.6(L)  Hematocrit 39 - 52 %  34.0(L) 33.5(L) 36.0(L)  Platelets 150 - 400 K/uL 163 247 152    CMP Latest Ref Rng & Units 12/03/2019 09/01/2019 08/23/2019  Glucose 70 - 99 mg/dL 035(W) 75 656(C)  BUN 6 - 20 mg/dL 8 12 10   Creatinine 0.61 - 1.24 mg/dL 1.27 5.17  Sodium 135 - 145 mmol/L 141 137 139  Potassium 3.5 - 5.1 mmol/L 3.7 4.2 4.1  Chloride 98 - 111 mmol/L 105 99 101  CO2 22 - 32 mmol/L 25 27 29   Calcium 8.9 - 10.3 mg/dL 9.3 9.6 9.2  Total Protein 6.5 - 8.1 g/dL 6.8  7.9 7.6  Total Bilirubin 0.3 - 1.2 mg/dL 2.0(H) 1.6(H) 2.0(H)  Alkaline Phos 38 - 126 U/L 40 47 39  AST 15 - 41 U/L 16 15 13(L)  ALT 0 - 44 U/L 13 11 9     RADIOGRAPHIC STUDIES: No results found.  ASSESSMENT & PLAN Troy Schwartz 39 y.o. male with medical history significant for recurrent splenomegaly who presents for a follow up visit.  After review the labs, discussion with the patient, and reviewed the prior records I do think it is reasonable to consider splenectomy in this patient.  I have recommended referral to surgery previously 2 months ago, however the patient was unable to make contact with them.  He believes this is because he avoids numbers he does not know on his phone due to telemarketer's.  His most recent episode was mild in nature and occurred after working in the yard.  He has had more severe episodes previously, almost always associated with periods of high stress or infection.  Sickle cell trait is the most likely etiology for this patient's recurrent splenomegaly.  As such I did not think there is a modifiable risk factor and surgery would be a definitive means of management.  Additionally the patient does not currently have a PCP and therefore we have made a referral to [place] primary care.  # Splenomegaly --prior CT imaging confirms that the patient has an enlarged spleen, matching with his current symptoms. --patient notes he had episodes of splenomegaly and abdominal [place]. It was attributed to Sickle  cell trait at that time. -- splenomegaly and splenic infarcts are known to occur in individuals with sickle cell trait, though this is not common. -- Hgb electrophoresis confirmed the diagnosis of sickle cell trait --recommend the patient meet with surgery to discuss splenectomy to relieve his symptoms.  --RTC in 3 months  #Health Maintenance --patient is requesting a new PCP. Will refer Troy Schwartz to [place] [facility] medicine to establish care (done last visit, patient did not make contact)  --patient is underweight, recent weight loss with COVID in April 2021.  --no other chronic conditions known at this time.   Orders Placed This Encounter  Procedures  . Ambulatory referral to General Surgery    Referral Priority:   Routine    Referral Type:   Surgical    Referral Reason:   Specialty Services Required    Referred to Provider:   May 2021, MD    Requested Specialty:   General Surgery    Number of Visits Requested:   1  . Ambulatory referral to Internal Medicine    Referral Priority:   Routine    Referral Type:   Consultation    Referral Reason:   Specialty Services Required    Requested Specialty:   Internal Medicine    Number of Visits Requested:   1    All questions were answered. The patient knows to call the clinic with any problems, questions or concerns.  A total of more than 30 minutes were spent on this encounter and over half of that time was spent on counseling and coordination of care as outlined above.   Almond Lint, MD Department of Hematology/Oncology [facility] Phone: [phone] Pager: [phone] Email: [email or web address]  12/03/2019 12:58 PM

## 2019-12-05 ENCOUNTER — Telehealth: Payer: Self-pay | Admitting: Hematology and Oncology

## 2019-12-05 NOTE — Telephone Encounter (Signed)
Scheduled per los. Called and left msg. Mailed printout  °

## 2020-04-16 DIAGNOSIS — R161 Splenomegaly, not elsewhere classified: Secondary | ICD-10-CM | POA: Diagnosis not present

## 2020-04-16 DIAGNOSIS — D573 Sickle-cell trait: Secondary | ICD-10-CM | POA: Diagnosis not present

## 2020-05-26 DIAGNOSIS — H6123 Impacted cerumen, bilateral: Secondary | ICD-10-CM | POA: Diagnosis not present

## 2020-05-26 DIAGNOSIS — Z03818 Encounter for observation for suspected exposure to other biological agents ruled out: Secondary | ICD-10-CM | POA: Diagnosis not present

## 2020-05-26 DIAGNOSIS — H66002 Acute suppurative otitis media without spontaneous rupture of ear drum, left ear: Secondary | ICD-10-CM | POA: Diagnosis not present

## 2020-05-26 DIAGNOSIS — Z20822 Contact with and (suspected) exposure to covid-19: Secondary | ICD-10-CM | POA: Diagnosis not present

## 2020-06-03 ENCOUNTER — Other Ambulatory Visit: Payer: Self-pay | Admitting: Hematology and Oncology

## 2020-06-03 ENCOUNTER — Inpatient Hospital Stay: Payer: BC Managed Care – PPO

## 2020-06-03 ENCOUNTER — Inpatient Hospital Stay: Payer: BC Managed Care – PPO | Attending: Hematology and Oncology | Admitting: Hematology and Oncology

## 2020-06-03 DIAGNOSIS — H66002 Acute suppurative otitis media without spontaneous rupture of ear drum, left ear: Secondary | ICD-10-CM | POA: Diagnosis not present

## 2020-06-03 DIAGNOSIS — R161 Splenomegaly, not elsewhere classified: Secondary | ICD-10-CM

## 2020-08-18 ENCOUNTER — Telehealth: Payer: Self-pay | Admitting: *Deleted

## 2020-08-18 NOTE — Telephone Encounter (Signed)
Received call from patient. He states that he does not feel good, feels that his spleen is bigger and his back hurts. He is asking to see Dr. Leonides Schanz.  Scheduled for 08/20/20-labs and MD

## 2020-08-20 ENCOUNTER — Encounter: Payer: Self-pay | Admitting: Hematology and Oncology

## 2020-08-20 ENCOUNTER — Inpatient Hospital Stay (HOSPITAL_BASED_OUTPATIENT_CLINIC_OR_DEPARTMENT_OTHER): Payer: BC Managed Care – PPO | Admitting: Hematology and Oncology

## 2020-08-20 ENCOUNTER — Other Ambulatory Visit: Payer: Self-pay

## 2020-08-20 ENCOUNTER — Inpatient Hospital Stay: Payer: BC Managed Care – PPO | Attending: Hematology and Oncology

## 2020-08-20 VITALS — BP 101/62 | HR 78 | Temp 97.4°F | Resp 19 | Ht 73.0 in | Wt 138.3 lb

## 2020-08-20 DIAGNOSIS — R161 Splenomegaly, not elsewhere classified: Secondary | ICD-10-CM | POA: Insufficient documentation

## 2020-08-20 DIAGNOSIS — D573 Sickle-cell trait: Secondary | ICD-10-CM | POA: Diagnosis not present

## 2020-08-20 LAB — CBC WITH DIFFERENTIAL (CANCER CENTER ONLY)
Abs Immature Granulocytes: 0.01 10*3/uL (ref 0.00–0.07)
Basophils Absolute: 0.1 10*3/uL (ref 0.0–0.1)
Basophils Relative: 1 %
Eosinophils Absolute: 0.2 10*3/uL (ref 0.0–0.5)
Eosinophils Relative: 3 %
HCT: 30.4 % — ABNORMAL LOW (ref 39.0–52.0)
Hemoglobin: 11.1 g/dL — ABNORMAL LOW (ref 13.0–17.0)
Immature Granulocytes: 0 %
Lymphocytes Relative: 29 %
Lymphs Abs: 1.9 10*3/uL (ref 0.7–4.0)
MCH: 29.4 pg (ref 26.0–34.0)
MCHC: 36.5 g/dL — ABNORMAL HIGH (ref 30.0–36.0)
MCV: 80.4 fL (ref 80.0–100.0)
Monocytes Absolute: 0.4 10*3/uL (ref 0.1–1.0)
Monocytes Relative: 7 %
Neutro Abs: 3.9 10*3/uL (ref 1.7–7.7)
Neutrophils Relative %: 60 %
Platelet Count: 114 10*3/uL — ABNORMAL LOW (ref 150–400)
RBC: 3.78 MIL/uL — ABNORMAL LOW (ref 4.22–5.81)
RDW: 13.2 % (ref 11.5–15.5)
WBC Count: 6.5 10*3/uL (ref 4.0–10.5)
nRBC: 0 % (ref 0.0–0.2)

## 2020-08-20 LAB — CMP (CANCER CENTER ONLY)
ALT: 15 U/L (ref 0–44)
AST: 23 U/L (ref 15–41)
Albumin: 4.5 g/dL (ref 3.5–5.0)
Alkaline Phosphatase: 37 U/L — ABNORMAL LOW (ref 38–126)
Anion gap: 9 (ref 5–15)
BUN: 8 mg/dL (ref 6–20)
CO2: 26 mmol/L (ref 22–32)
Calcium: 9.2 mg/dL (ref 8.9–10.3)
Chloride: 104 mmol/L (ref 98–111)
Creatinine: 0.94 mg/dL (ref 0.61–1.24)
GFR, Estimated: >60 mL/min (ref >60)
Glucose, Bld: 110 mg/dL — ABNORMAL HIGH (ref 70–99)
Potassium: 3.8 mmol/L (ref 3.5–5.1)
Sodium: 139 mmol/L (ref 135–145)
Total Bilirubin: 2.7 mg/dL — ABNORMAL HIGH (ref 0.3–1.2)
Total Protein: 7.1 g/dL (ref 6.5–8.1)

## 2020-08-20 LAB — LACTATE DEHYDROGENASE: LDH: 275 U/L — ABNORMAL HIGH (ref 98–192)

## 2020-08-20 NOTE — Progress Notes (Signed)
[place] [phone]   Fax:(336) [phone]  PROGRESS NOTE  Patient Care Team: Trey Sailors, PA as PCP - General (Physician Assistant)  Hematological/Oncological History # Recurrent Splenomegaly, Likely 2/2 to Sickle Cell Trait 1) 08/23/2019: patient presented to the emergency department with 1-2 days of abdominal pain. CT abdomen revealed an enlarged spleen at 18 cm. No other adenopathy noted. No hepatic abnormalities. 2) 09/01/2019: establish care with Dr. Lorenso Courier    Interval History:  Troy Schwartz 40 y.o. male with medical history significant for recurrent splenomegaly who presents for an urgent follow up visit. The patient's last visit was on 12/03/2019. In the interim since the last visit Troy Schwartz had a flare in his spleen size/pain again last week after an episode of GI illness.   On exam today Troy Schwartz notes that his spleen flared up again as a result of a foodborne illness.  He notes that he "ate something bad on Tuesday and has been throwing up membranes since that".  He notes he has been trying to lay down and hydrate well with some considerable improvement in his symptoms.  He notes that the pain is about 3 or 4 out of 10 in severity on Tuesday, but today is reduced down to 1 out of 10 in severity.  He reports he can still "feel it".  He notes that he has not had any associated diarrhea.  Also reports that he picked up an ear infection at a funeral from a "sick little kid".  He notes that he was tried on antibiotics at [facility] and also had a flushing of his earwax which has provided considerable symptomatic relief.  He has otherwise been at his baseline level of health since our prior visit.  On further discussion he notes that he spoke with Dr. Barry Dienes at [facility] surgery but that he was not able to afford the co-pay necessary for the procedure.  Therefore he has not had his colectomy yet, though he does have a desire to pursue this.  He currently  denies any fevers, chills, sweats, nausea, vomiting, or diarrhea.  A full 10 point ROS is listed below.  MEDICAL HISTORY:  Past Medical History:  Diagnosis Date  . Sickle cell trait ([place])   . Splenomegaly     SURGICAL HISTORY: No past surgical history on file.  SOCIAL HISTORY: Social History   Socioeconomic History  . Marital status: Single    Spouse name: Not on file  . Number of children: Not on file  . Years of education: Not on file  . Highest education level: Not on file  Occupational History  . Not on file  Tobacco Use  . Smoking status: Former Research scientist (life sciences)  . Smokeless tobacco: Never Used  Vaping Use  . Vaping Use: Never used  Substance and Sexual Activity  . Alcohol use: Yes  . Drug use: Not on file  . Sexual activity: Not on file  Other Topics Concern  . Not on file  Social History Narrative  . Not on file   Social Determinants of Health   Financial Resource Strain: Not on file  Food Insecurity: Not on file  Transportation Needs: Not on file  Physical Activity: Not on file  Stress: Not on file  Social Connections: Not on file  Intimate Partner Violence: Not on file    FAMILY HISTORY: Family History  Problem Relation Age of Onset  . Healthy Mother   . Sickle cell trait Mother   . Healthy  Father   . Sickle cell trait Father   . Healthy Sister   . Healthy Brother     ALLERGIES:  has No Known Allergies.  MEDICATIONS:  No current outpatient medications on file.   No current facility-administered medications for this visit.    REVIEW OF SYSTEMS:   Constitutional: ( - ) fevers, ( - )  chills , ( - ) night sweats Eyes: ( - ) blurriness of vision, ( - ) double vision, ( - ) watery eyes Ears, nose, mouth, throat, and face: ( - ) mucositis, ( - ) sore throat Respiratory: ( - ) cough, ( - ) dyspnea, ( - ) wheezes Cardiovascular: ( - ) palpitation, ( - ) chest discomfort, ( - ) lower extremity swelling Gastrointestinal:  ( - ) nausea, ( - ) heartburn, (  - ) change in bowel habits Skin: ( - ) abnormal skin rashes Lymphatics: ( - ) new lymphadenopathy, ( - ) easy bruising Neurological: ( - ) numbness, ( - ) tingling, ( - ) new weaknesses Behavioral/Psych: ( - ) mood change, ( - ) new changes  All other systems were reviewed with the patient and are negative.  PHYSICAL EXAMINATION: ECOG PERFORMANCE STATUS: 0 - Asymptomatic  Vitals:   08/20/20 1428  BP: 101/62  Pulse: 78  Resp: 19  Temp: (!) 97.4 F (36.3 C)  SpO2: 98%   Filed Weights   08/20/20 1428  Weight: 138 lb 4.8 oz (62.7 kg)    GENERAL: well appearing middle aged [place] American male. alert, no distress and comfortable SKIN: skin color, texture, turgor are normal, no rashes or significant lesions EYES: conjunctiva are pink and non-injected, sclera clear LUNGS: clear to auscultation and percussion with normal breathing effort HEART: regular rate & rhythm and no murmurs and no lower extremity edema ABDOMEN: soft, non-tender, non-distended, normal bowel sounds. Notable enlarged spleen, at least 3 inches below the costal margin.  Musculoskeletal: no cyanosis of digits and no clubbing  PSYCH: alert & oriented x 3, fluent speech NEURO: no focal motor/sensory deficits  LABORATORY DATA:  I have reviewed the data as listed CBC Latest Ref Rng & Units 08/20/2020 12/03/2019 09/01/2019  WBC 4.0 - 10.5 K/uL 6.5 8.1 9.2  Hemoglobin 13.0 - 17.0 g/dL 11.1(L) 12.3(L) 11.8(L)  Hematocrit 39.0 - 52.0 % 30.4(L) 34.0(L) 33.5(L)  Platelets 150 - 400 K/uL 114(L) 163 247    CMP Latest Ref Rng & Units 08/20/2020 12/03/2019 09/01/2019  Glucose 70 - 99 mg/dL 110(H) 107(H) 75  BUN 6 - 20 mg/dL 8 8 12   Creatinine 0.61 - 1.24 mg/dL 0.94 0.99 0.99  Sodium 135 - 145 mmol/L 139 141 137  Potassium 3.5 - 5.1 mmol/L 3.8 3.7 4.2  Chloride 98 - 111 mmol/L 104 105 99  CO2 22 - 32 mmol/L 26 25 27   Calcium 8.9 - 10.3 mg/dL 9.2 9.3 9.6  Total Protein 6.5 - 8.1 g/dL 7.1 6.8 7.9  Total Bilirubin 0.3 - 1.2 mg/dL  2.7(H) 2.0(H) 1.6(H)  Alkaline Phos 38 - 126 U/L 37(L) 40 47  AST 15 - 41 U/L 23 16 15   ALT 0 - 44 U/L 15 13 11     RADIOGRAPHIC STUDIES: No results found.  ASSESSMENT & PLAN Troy Schwartz 40 y.o. male with medical history significant for recurrent splenomegaly 2/2 to sickle cell trait who presents for a follow up visit.    After review the labs, discussion with the patient, and review of the prior records I do think it  is reasonable to consider splenectomy in this patient.  His most recent episode was mild in nature and occurred after working in the yard.  He has had more severe episodes previously, almost always associated with periods of high stress or infection.  Sickle cell trait is the most likely etiology for this patient's recurrent splenomegaly.  As such I did not think there is a modifiable risk factor and surgery would be a definitive means of management.  Patient did meet with Dr. Barry Dienes but unfortunately due to financial issues was not able to afford splenectomy.  We will currently work towards finding the patient resources to get this procedure performed centiliter would markedly improve his quality of life.  # Splenomegaly, stable --prior CT imaging confirms that the patient has an enlarged spleen, matching with his current symptoms. --patient notes he had episodes of splenomegaly and abdominal pain previously in [place]. It was attributed to Sickle cell trait at that time. -- splenomegaly and splenic infarcts are known to occur in individuals with sickle cell trait, though this is not common. -- Hgb electrophoresis confirmed the diagnosis of sickle cell trait --recommend the patient meet with surgery to discuss splenectomy to relieve his symptoms. He met with Dr. Barry Dienes but was not able to afford the surgery. We will look into financial resources for him today. Can also consider referral to [facility] surgery.  --RTC in 6 months  #Elevated Bilirubin -- Likely increased in the  setting of recent GI illness, potentially due to hemolysis of sickle cells and enlarged spleen -- Continue to monitor  #Health Maintenance --patient is requesting a new PCP. Will refer Mr. Cornette to [facility] internal medicine to establish care (done last visit, patient did not make contact)  --patient is underweight, recent weight loss with COVID in April 2021.  --no other chronic conditions known at this time.   No orders of the defined types were placed in this encounter.   All questions were answered. The patient knows to call the clinic with any problems, questions or concerns.  A total of more than 30 minutes were spent on this encounter and over half of that time was spent on counseling and coordination of care as outlined above.   Ledell Peoples, MD Department of Hematology/Oncology [place] [facility] Phone: [phone] Pager: [phone] Email: [email or web address]  08/20/2020 3:22 PM

## 2020-08-25 ENCOUNTER — Telehealth: Payer: Self-pay | Admitting: Hematology and Oncology

## 2020-08-25 NOTE — Telephone Encounter (Signed)
Scheduled per los. Called and left msg. Mailed printout  °

## 2020-08-30 ENCOUNTER — Ambulatory Visit (INDEPENDENT_AMBULATORY_CARE_PROVIDER_SITE_OTHER): Payer: BC Managed Care – PPO | Admitting: Internal Medicine

## 2020-08-30 ENCOUNTER — Other Ambulatory Visit: Payer: Self-pay

## 2020-08-30 ENCOUNTER — Other Ambulatory Visit: Payer: BC Managed Care – PPO

## 2020-08-30 ENCOUNTER — Encounter: Payer: Self-pay | Admitting: Internal Medicine

## 2020-08-30 VITALS — BP 116/72 | HR 63 | Temp 98.5°F | Ht 73.0 in | Wt 142.0 lb

## 2020-08-30 DIAGNOSIS — D539 Nutritional anemia, unspecified: Secondary | ICD-10-CM

## 2020-08-30 DIAGNOSIS — Z0001 Encounter for general adult medical examination with abnormal findings: Secondary | ICD-10-CM | POA: Diagnosis not present

## 2020-08-30 DIAGNOSIS — D571 Sickle-cell disease without crisis: Secondary | ICD-10-CM | POA: Diagnosis not present

## 2020-08-30 DIAGNOSIS — D696 Thrombocytopenia, unspecified: Secondary | ICD-10-CM

## 2020-08-30 DIAGNOSIS — R739 Hyperglycemia, unspecified: Secondary | ICD-10-CM

## 2020-08-30 DIAGNOSIS — H918X2 Other specified hearing loss, left ear: Secondary | ICD-10-CM

## 2020-08-30 DIAGNOSIS — D520 Dietary folate deficiency anemia: Secondary | ICD-10-CM

## 2020-08-30 DIAGNOSIS — D508 Other iron deficiency anemias: Secondary | ICD-10-CM

## 2020-08-30 DIAGNOSIS — H9192 Unspecified hearing loss, left ear: Secondary | ICD-10-CM | POA: Insufficient documentation

## 2020-08-30 LAB — PSA: PSA: 0.77 ng/mL (ref 0.10–4.00)

## 2020-08-30 LAB — VITAMIN B12: Vitamin B-12: 361 pg/mL (ref 211–911)

## 2020-08-30 LAB — FERRITIN: Ferritin: 118.8 ng/mL (ref 22.0–322.0)

## 2020-08-30 LAB — FOLATE: Folate: 3.5 ng/mL — ABNORMAL LOW (ref >5.9)

## 2020-08-30 NOTE — Patient Instructions (Signed)
[employer] medicine (25th ed., pp. [phone]). [place], [place]: Elsevier.">  Anemia  Anemia is a condition in which there is not enough red blood cells or hemoglobin in the blood. Hemoglobin is a substance in red blood cells that carries oxygen. When you do not have enough red blood cells or hemoglobin (are anemic), your body cannot get enough oxygen and your organs may not work properly. As a result, you may feel very tired or have other problems. What are the causes? Common causes of anemia include:  Excessive bleeding. Anemia can be caused by excessive bleeding inside or outside the body, including bleeding from the intestines or from heavy menstrual periods in females.  Poor nutrition.  Long-lasting (chronic) kidney, thyroid, and liver disease.  Bone marrow disorders, spleen problems, and blood disorders.  Cancer and treatments for cancer.  HIV (human immunodeficiency virus) and AIDS (acquired immunodeficiency syndrome).  Infections, medicines, and autoimmune disorders that destroy red blood cells. What are the signs or symptoms? Symptoms of this condition include:  Minor weakness.  Dizziness.  Headache, or difficulties concentrating and sleeping.  Heartbeats that feel irregular or faster than normal (palpitations).  Shortness of breath, especially with exercise.  Pale skin, lips, and nails, or cold hands and feet.  Indigestion and nausea. Symptoms may occur suddenly or develop slowly. If your anemia is mild, you may not have symptoms. How is this diagnosed? This condition is diagnosed based on blood tests, your medical history, and a physical exam. In some cases, a test may be needed in which cells are removed from the soft tissue inside of a bone and looked at under a microscope (bone marrow biopsy). Your health care provider may also check your stool (feces) for blood and may do additional testing to look for the cause of your bleeding. Other tests may  include:  Imaging tests, such as a CT scan or MRI.  A procedure to see inside your esophagus and stomach (endoscopy).  A procedure to see inside your colon and rectum (colonoscopy). How is this treated? Treatment for this condition depends on the cause. If you continue to lose a lot of blood, you may need to be treated at a hospital. Treatment may include:  Taking supplements of iron, vitamin Q68, or folic acid.  Taking a hormone medicine (erythropoietin) that can help to stimulate red blood cell growth.  Having a blood transfusion. This may be needed if you lose a lot of blood.  Making changes to your diet.  Having surgery to remove your spleen. Follow these instructions at home:  Take over-the-counter and prescription medicines only as told by your health care provider.  Take supplements only as told by your health care provider.  Follow any diet instructions that you were given by your health care provider.  Keep all follow-up visits as told by your health care provider. This is important. Contact a health care provider if:  You develop new bleeding anywhere in the body. Get help right away if:  You are very weak.  You are short of breath.  You have pain in your abdomen or chest.  You are dizzy or feel faint.  You have trouble concentrating.  You have bloody stools, black stools, or tarry stools.  You vomit repeatedly or you vomit up blood. These symptoms may represent a serious problem that is an emergency. Do not wait to see if the symptoms will go away. Get medical help right away. Call your local emergency services (911 in the [place] Do not  drive yourself to the hospital. Summary  Anemia is a condition in which you do not have enough red blood cells or enough of a substance in your red blood cells that carries oxygen (hemoglobin).  Symptoms may occur suddenly or develop slowly.  If your anemia is mild, you may not have symptoms.  This condition is  diagnosed with blood tests, a medical history, and a physical exam. Other tests may be needed.  Treatment for this condition depends on the cause of the anemia. This information is not intended to replace advice given to you by your health care provider. Make sure you discuss any questions you have with your health care provider. Document Revised: 02/18/2019 Document Reviewed: 02/18/2019 Elsevier Patient Education  2021 Elsevier Inc.  

## 2020-08-30 NOTE — Progress Notes (Signed)
Subjective:  Patient ID: Troy Schwartz, male    DOB: [birth year]  Age: 40 y.o. MRN: [id number]  CC: Annual Exam and Anemia  This visit occurred during the SARS-CoV-2 public health emergency.  Safety protocols were in place, including screening questions prior to the visit, additional usage of staff PPE, and extensive cleaning of exam room while observing appropriate contact time as indicated for disinfecting solutions.    HPI Troy Schwartz presents for a CPX and to establish.  He tells me that he had an ear infection 3 months ago and complains of tinnitus and loss of hearing in the left ear.  He has a history of sickle cell disease and says he recently had a sickle cell crisis that caused low back pain.  He is followed by a hematologist.  He also has a history of anemia.  He is active and denies any recent episodes of chest pain, shortness of breath, abdominal pain, melena, or bright red blood per rectum.  History Troy Schwartz has a past medical history of Depression, Sickle cell trait (HCC), and Splenomegaly.   He has no past surgical history on file.   His family history includes Healthy in his brother, father, mother, and sister; Sickle cell trait in his father and mother.He reports that he has been smoking cigarettes. He started smoking about 19 years ago. He has a 22.00 pack-year smoking history. He has never used smokeless tobacco. He reports previous alcohol use. He reports that he does not use drugs.  No outpatient medications prior to visit.   No facility-administered medications prior to visit.    ROS Review of Systems  Constitutional:  Negative for appetite change, diaphoresis, fatigue and unexpected weight change.  HENT:  Positive for hearing loss and tinnitus. Negative for ear discharge, ear pain and sore throat.   Respiratory:  Negative for cough, chest tightness, shortness of breath and wheezing.   Cardiovascular:  Negative for chest pain, palpitations and leg swelling.   Gastrointestinal:  Negative for abdominal pain, blood in stool, constipation and diarrhea.  Endocrine: Negative.   Genitourinary: Negative.  Negative for difficulty urinating, scrotal swelling, testicular pain and urgency.  Musculoskeletal: Negative.  Negative for arthralgias and myalgias.  Skin: Negative.   Allergic/Immunologic: Negative.   Neurological: Negative.  Negative for dizziness, weakness and light-headedness.  Hematological:  Negative for adenopathy. Does not bruise/bleed easily.  Psychiatric/Behavioral: Negative.     Objective:  BP 116/72 (BP Location: Right Arm, Patient Position: Sitting, Cuff Size: Large)   Pulse 63   Temp 98.5 F (36.9 C) (Oral)   Ht 6\' 1"  (1.854 m)   Wt 142 lb (64.4 kg)   SpO2 99%   BMI 18.73 kg/m   Physical Exam Vitals reviewed.  HENT:     Right Ear: Hearing, tympanic membrane, ear canal and external ear normal.     Left Ear: Hearing, tympanic membrane, ear canal and external ear normal.     Nose: Nose normal.     Mouth/Throat:     Mouth: Mucous membranes are moist.  Eyes:     General: No scleral icterus.    Conjunctiva/sclera: Conjunctivae normal.  Cardiovascular:     Rate and Rhythm: Normal rate and regular rhythm.     Heart sounds: No murmur heard. Pulmonary:     Effort: Pulmonary effort is normal.     Breath sounds: No stridor. No wheezing, rhonchi or rales.  Abdominal:     General: Abdomen is flat.     Palpations: There is no  mass.     Tenderness: There is no abdominal tenderness. There is no guarding.     Hernia: No hernia is present.  Musculoskeletal:        General: Normal range of motion.     Cervical back: Neck supple.     Right lower leg: No edema.     Left lower leg: No edema.  Lymphadenopathy:     Cervical: No cervical adenopathy.  Skin:    General: Skin is warm and dry.  Neurological:     General: No focal deficit present.  Psychiatric:        Mood and Affect: Mood normal.        Behavior: Behavior normal.     Lab Results  Component Value Date   WBC 6.5 08/20/2020   HGB 11.1 (L) 08/20/2020   HCT 30.4 (L) 08/20/2020   PLT 114 (L) 08/20/2020   GLUCOSE 78 08/30/2020   CHOL 121 08/30/2020   TRIG 61.0 08/30/2020   HDL 38.20 (L) 08/30/2020   LDLCALC 70 08/30/2020   ALT 15 08/20/2020   AST 23 08/20/2020   NA 140 08/30/2020   K 4.2 08/30/2020   CL 105 08/30/2020   CREATININE 1.01 08/30/2020   BUN 10 08/30/2020   CO2 27 08/30/2020   PSA 0.77 08/30/2020   HGBA1C <4.0 08/30/2020    Assessment & Plan:   Troy Schwartz was seen today for annual exam and anemia.  Diagnoses and all orders for this visit:  Sickle cell disease without crisis [facility])- The crisis has resolved.  Deficiency anemia- I will screen him for vitamin deficiencies. -     Iron; Future -     Vitamin B12; Future -     Folate; Future -     Ferritin; Future -     Vitamin B1; Future -     Zinc; Future -     Vitamin B1 -     Ferritin -     Folate -     Vitamin B12 -     Iron  Encounter for general adult medical examination with abnormal findings- Exam completed, labs reviewed, vaccines reviewed - He refused a Pneumovax and Tdap, patient education was given. -     Lipid panel; Future -     PSA; Future -     PSA -     Lipid panel  Hyperglycemia- His blood sugar is normal now. -     Basic metabolic panel; Future -     Hemoglobin A1c; Future -     Hemoglobin A1c -     Basic metabolic panel  Thrombocytopenia (HCC)- Will screen for B12 and folate deficiency.  Other specified hearing loss of left ear, unspecified hearing status on contralateral side -     Ambulatory referral to Audiology  Other iron deficiency anemia- I recommended that he treat this with both oral and parenteral iron replacement therapy. -     Ferric Maltol (ACCRUFER) 30 MG CAPS; Take 1 capsule by mouth in the morning and at bedtime. -     ferric carboxymaltose (INJECTAFER) 750 mg in sodium chloride 0.9 % 250 mL IVPB  Dietary folate deficiency anemia -      folic acid (FOLVITE) 1 MG tablet; Take 1 tablet (1 mg total) by mouth daily.  I am having Troy Schwartz start on folic acid and ACCRUFeR.  Meds ordered this encounter  Medications   folic acid (FOLVITE) 1 MG tablet    Sig: Take 1 tablet (1 mg  total) by mouth daily.    Dispense:  90 tablet    Refill:  1   Ferric Maltol (ACCRUFER) 30 MG CAPS    Sig: Take 1 capsule by mouth in the morning and at bedtime.    Dispense:  180 capsule    Refill:  1     Follow-up: Return in about 3 months (around 11/30/2020).  Sanda Linger, MD

## 2020-08-30 NOTE — Addendum Note (Signed)
Addended by: Elita Boone E on: 08/30/2020 11:57 AM   Modules accepted: Orders

## 2020-08-31 ENCOUNTER — Telehealth: Payer: Self-pay

## 2020-08-31 DIAGNOSIS — D649 Anemia, unspecified: Secondary | ICD-10-CM | POA: Insufficient documentation

## 2020-08-31 DIAGNOSIS — D520 Dietary folate deficiency anemia: Secondary | ICD-10-CM | POA: Insufficient documentation

## 2020-08-31 LAB — LIPID PANEL
Cholesterol: 121 mg/dL (ref 0–200)
HDL: 38.2 mg/dL — ABNORMAL LOW (ref >39.00)
LDL Cholesterol: 70 mg/dL (ref 0–99)
NonHDL: 82.31
Total CHOL/HDL Ratio: 3
Triglycerides: 61 mg/dL (ref 0.0–149.0)
VLDL: 12.2 mg/dL (ref 0.0–40.0)

## 2020-08-31 LAB — BASIC METABOLIC PANEL
BUN: 10 mg/dL (ref 6–23)
CO2: 27 mEq/L (ref 19–32)
Calcium: 9.3 mg/dL (ref 8.4–10.5)
Chloride: 105 mEq/L (ref 96–112)
Creatinine, Ser: 1.01 mg/dL (ref 0.40–1.50)
GFR: 92.95 mL/min (ref >60.00)
Glucose, Bld: 78 mg/dL (ref 70–99)
Potassium: 4.2 mEq/L (ref 3.5–5.1)
Sodium: 140 mEq/L (ref 135–145)

## 2020-08-31 LAB — HEMOGLOBIN A1C: Hgb A1c MFr Bld: <4 % of total Hgb (ref <5.7)

## 2020-08-31 LAB — IRON: Iron: 20 ug/dL — ABNORMAL LOW (ref 42–165)

## 2020-08-31 MED ORDER — ACCRUFER 30 MG PO CAPS: 1.0000 | ORAL_CAPSULE | Freq: Two times a day (BID) | ORAL | 1 refills | Status: AC

## 2020-08-31 MED ORDER — FOLIC ACID 1 MG PO TABS: 1.0000 mg | ORAL_TABLET | Freq: Every day | ORAL | 1 refills | Status: DC

## 2020-08-31 NOTE — Telephone Encounter (Signed)
PA started with Cover My Meds Key: [id number]

## 2020-09-02 ENCOUNTER — Other Ambulatory Visit: Payer: Self-pay | Admitting: Internal Medicine

## 2020-09-02 DIAGNOSIS — D500 Iron deficiency anemia secondary to blood loss (chronic): Secondary | ICD-10-CM

## 2020-09-02 NOTE — Telephone Encounter (Signed)
PA denied.  Denial letter to be sent to office. Please consider Venofer or oral iron supplements.  eAppeal available if desired.

## 2020-09-03 ENCOUNTER — Encounter: Payer: Self-pay | Admitting: Internal Medicine

## 2020-09-03 ENCOUNTER — Other Ambulatory Visit: Payer: Self-pay | Admitting: Internal Medicine

## 2020-09-04 LAB — VITAMIN B1: Vitamin B1 (Thiamine): <6 nmol/L — ABNORMAL LOW (ref 8–30)

## 2020-09-05 ENCOUNTER — Other Ambulatory Visit: Payer: Self-pay | Admitting: Internal Medicine

## 2020-09-05 DIAGNOSIS — D538 Other specified nutritional anemias: Secondary | ICD-10-CM | POA: Insufficient documentation

## 2020-09-05 MED ORDER — VITAMIN B-1 50 MG PO TABS: 50.0000 mg | ORAL_TABLET | Freq: Every day | ORAL | 1 refills | Status: AC

## 2020-09-05 NOTE — Progress Notes (Signed)
[name] -  Your thiamine level is undetectable.  I have sent a supplement to your pharmacy to treat this.  TJ

## 2020-11-08 ENCOUNTER — Telehealth: Payer: Self-pay | Admitting: Internal Medicine

## 2020-11-08 NOTE — Telephone Encounter (Signed)
Patient's job is starting in person meetings once monthly for safety. Patient is requesting a medical note from his provider stating that he can still do these virtually and not in office  Patient states his genetic blood disorder should be reason that he should not have to attend  Please call the patient and advise him if this note can be done for him  Patient informed that any paperwork could take 7-10 business days

## 2020-11-09 NOTE — Telephone Encounter (Signed)
Note has been done, given to PCP for review and signature.

## 2020-11-09 NOTE — Telephone Encounter (Signed)
Letter has been signe d and mailed out per pt's request during phone conversation informing him letter was completed.

## 2021-02-28 ENCOUNTER — Inpatient Hospital Stay: Payer: BC Managed Care – PPO

## 2021-02-28 ENCOUNTER — Other Ambulatory Visit: Payer: Self-pay | Admitting: Hematology and Oncology

## 2021-02-28 ENCOUNTER — Inpatient Hospital Stay: Payer: BC Managed Care – PPO | Attending: Hematology and Oncology | Admitting: Hematology and Oncology

## 2021-02-28 DIAGNOSIS — D573 Sickle-cell trait: Secondary | ICD-10-CM

## 2021-03-09 ENCOUNTER — Other Ambulatory Visit: Payer: Self-pay | Admitting: Internal Medicine

## 2021-03-09 DIAGNOSIS — D520 Dietary folate deficiency anemia: Secondary | ICD-10-CM

## 2021-12-24 IMAGING — CR DG CHEST 2V
2 series · 2 of 2 positions shown · non-contrast
Comparison: None.

CLINICAL DATA: Left upper quadrant pain for 2 days.

EXAM:
CHEST - 2 VIEW

[w chest pa]
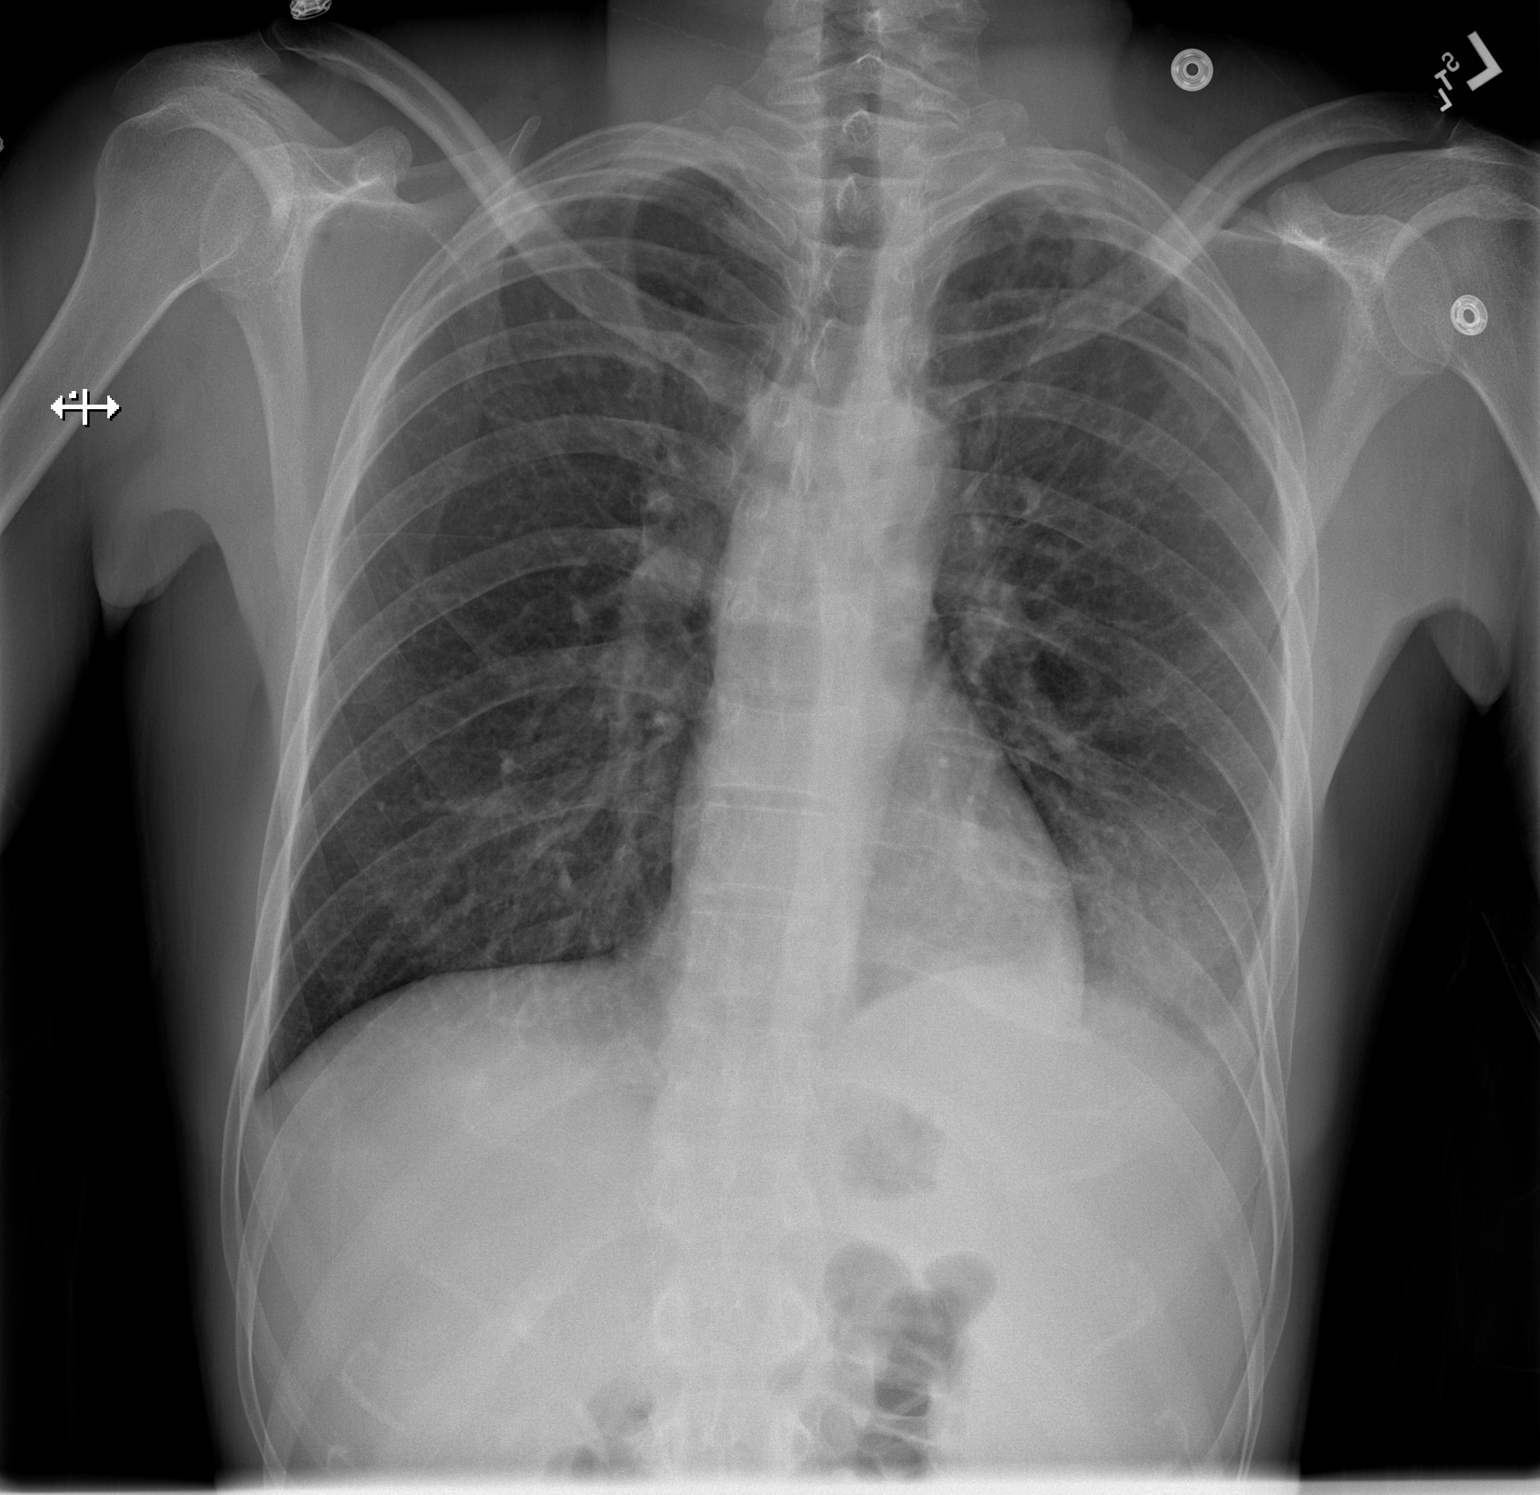

[w chest lat]
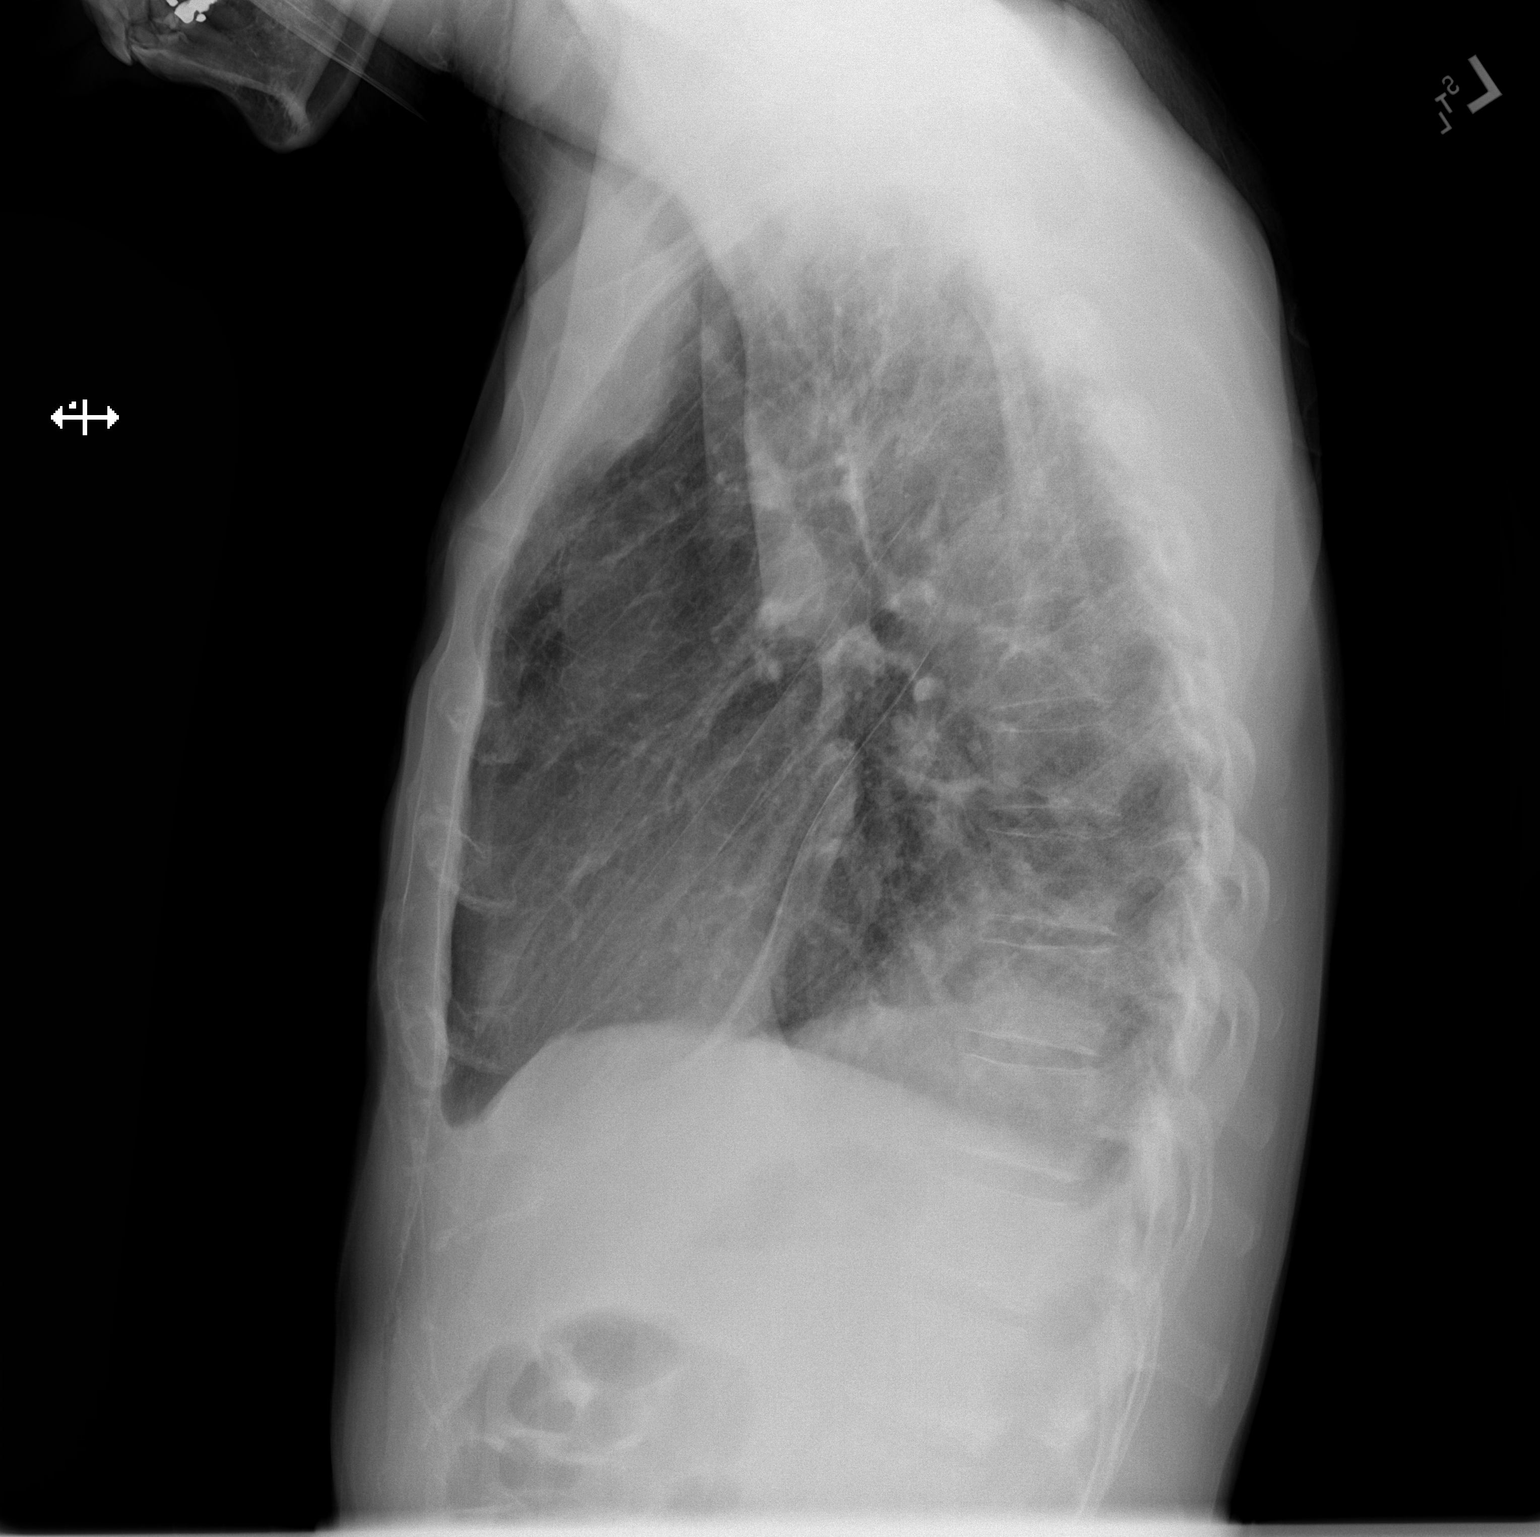

[2 of 2 positions shown; findings below may reference images not displayed]

FINDINGS: The heart size and mediastinal contours are within normal limits.
Airspace disease is seen in the posterior left lower lobe,
suspicious for pneumonia. Right lung is clear. No evidence of
pleural effusion.
IMPRESSION: Posterior left lower lobe airspace disease, suspicious for
pneumonia. Continued radiographic follow-up is recommended to
confirm resolution.

## 2022-01-02 IMAGING — CT CT ANGIO CHEST
2 of 6 series · 18 of 46 positions shown · IV contrast (OMNIPAQUE)
Comparison: Chest radiograph August 23, 2019. CT abdomen and pelvis

CLINICAL DATA: Chest pain and shortness of breath

EXAM:
CT ANGIOGRAPHY CHEST WITH CONTRAST
TECHNIQUE: Multidetector CT imaging of the chest was performed using the
standard protocol during bolus administration of intravenous
contrast. Multiplanar CT image reconstructions and MIPs were
obtained to evaluate the vascular anatomy.
CONTRAST:  100mL OMNIPAQUE IOHEXOL 350 MG/ML SOLN

[Series 5: thins · axial · 0.68mm/px · z∈[-370,-56]mm · 16 of 345 slices shown]
[im 15/345  lung]
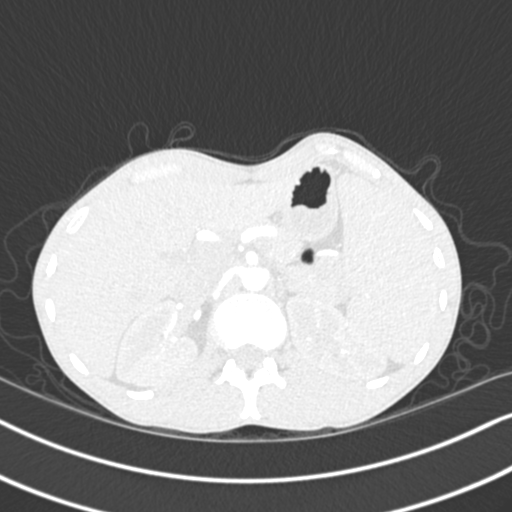
[im 45/345  soft-tissue]
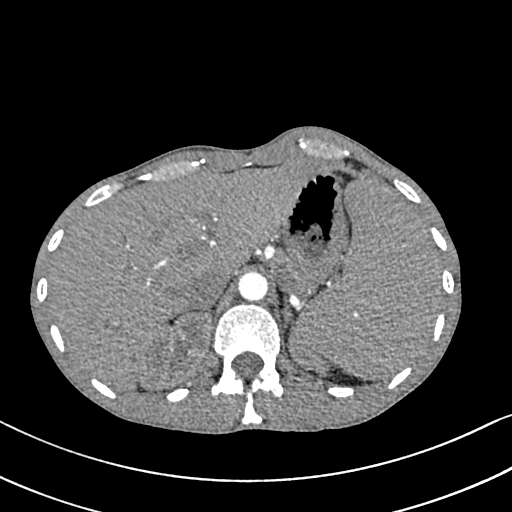
[im 60/345  lung]
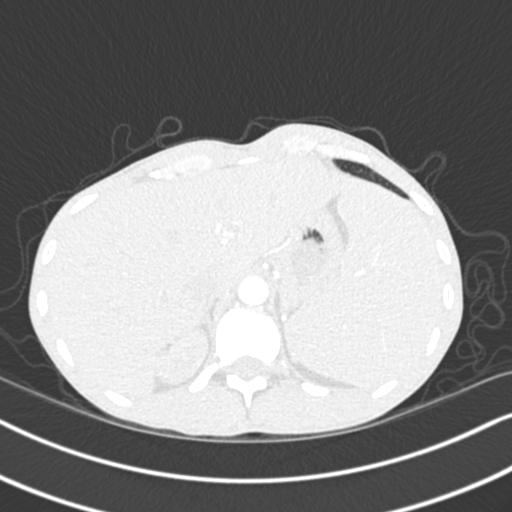
[im 75/345  soft-tissue]
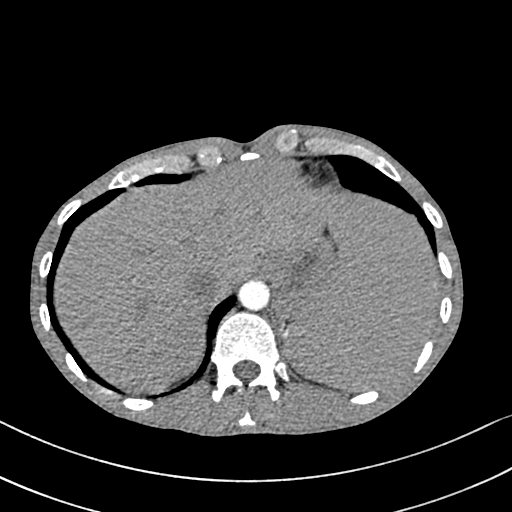
[im 105/345  lung]
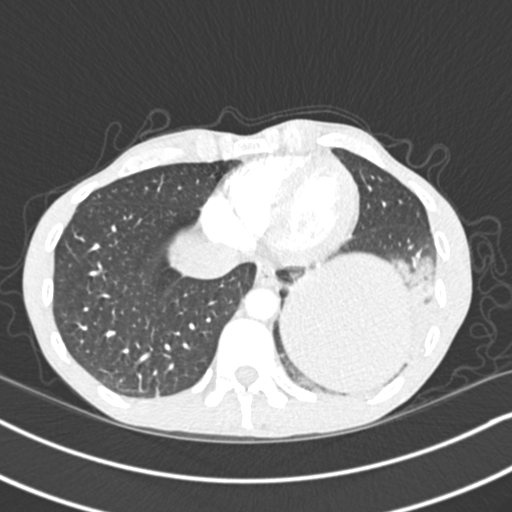
[im 120/345  soft-tissue]
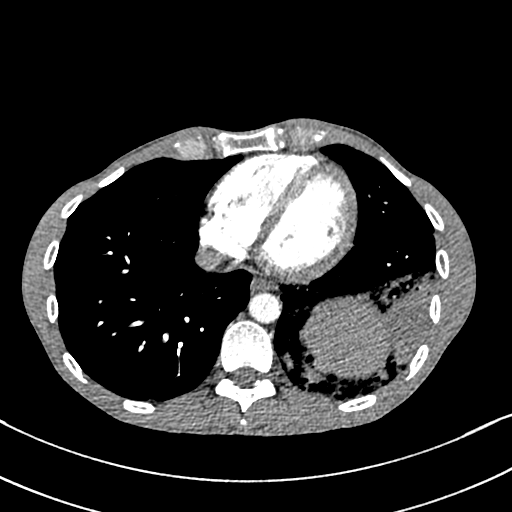
[im 135/345  lung]
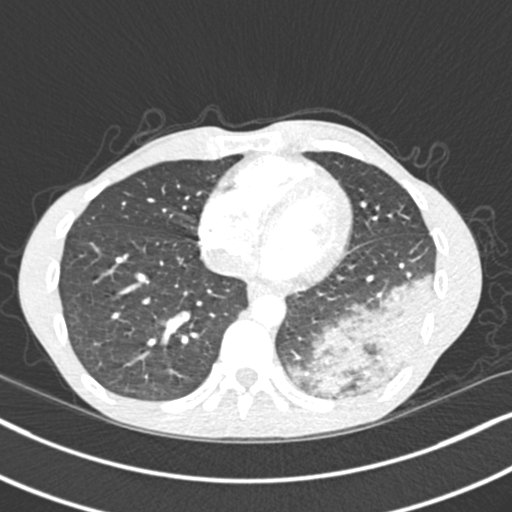
[im 165/345  soft-tissue]
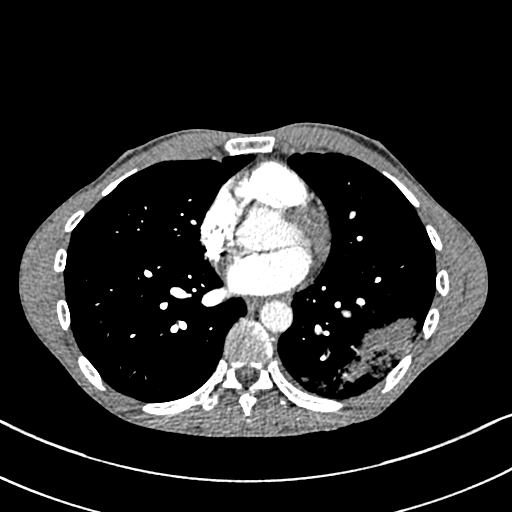
[im 180/345  lung]
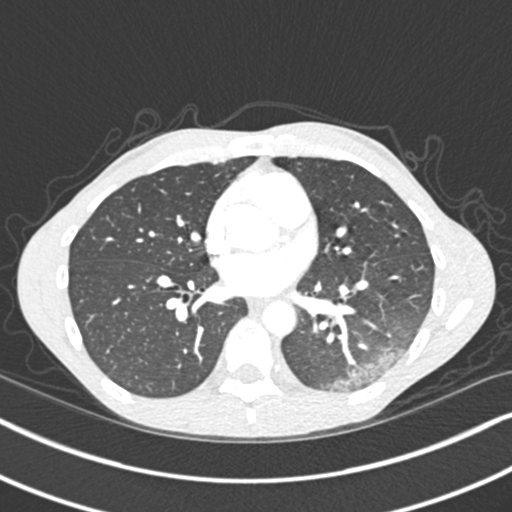
[im 210/345  soft-tissue]
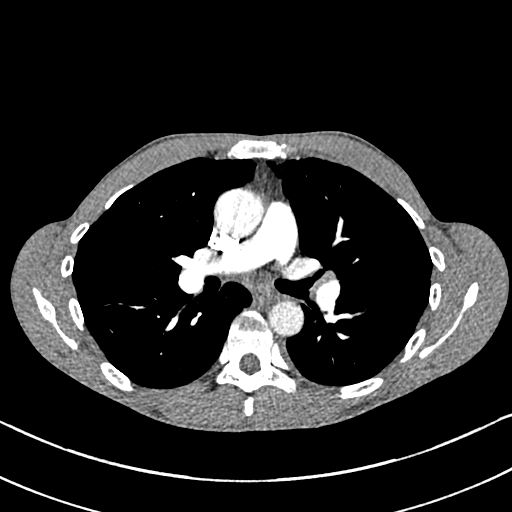
[im 225/345  lung]
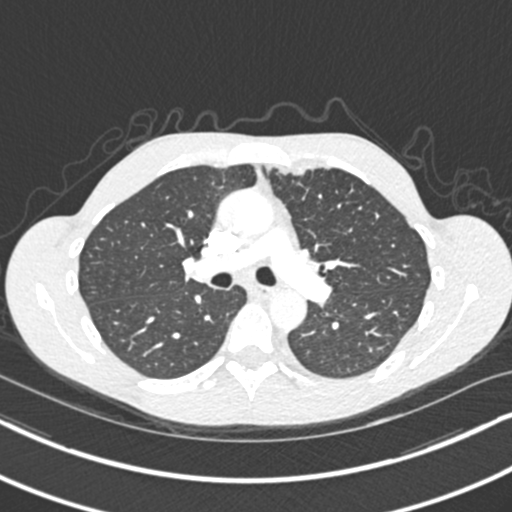
[im 240/345  soft-tissue]
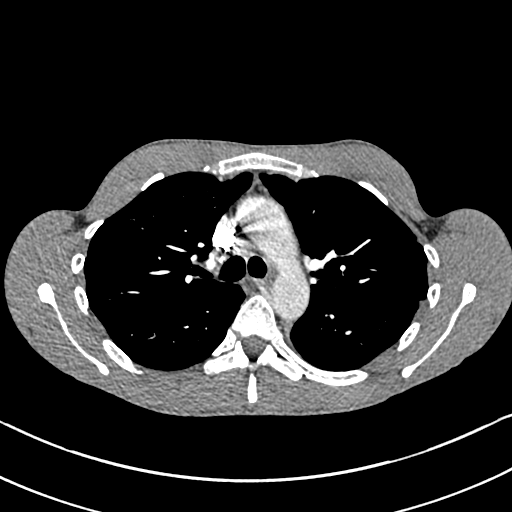
[im 270/345  lung]
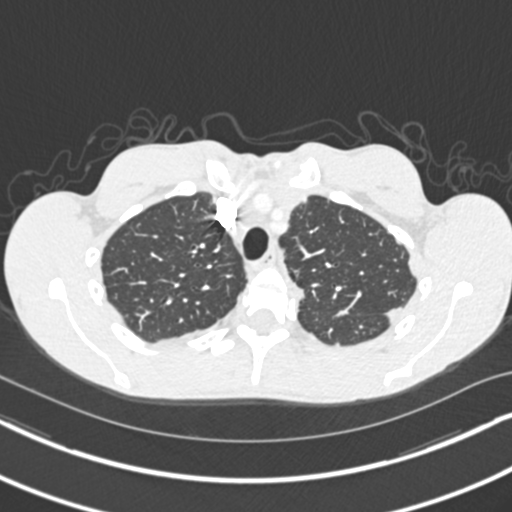
[im 285/345  soft-tissue]
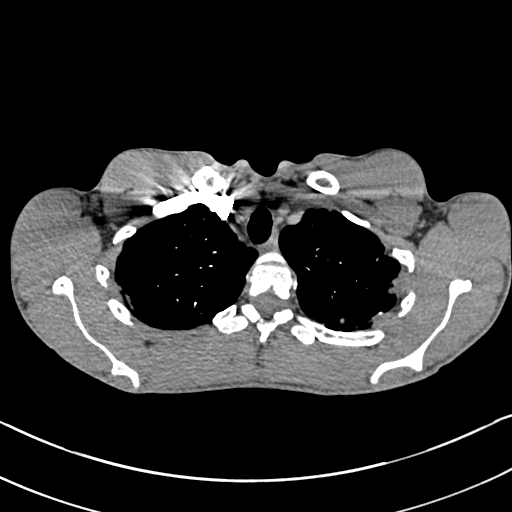
[im 300/345  lung]
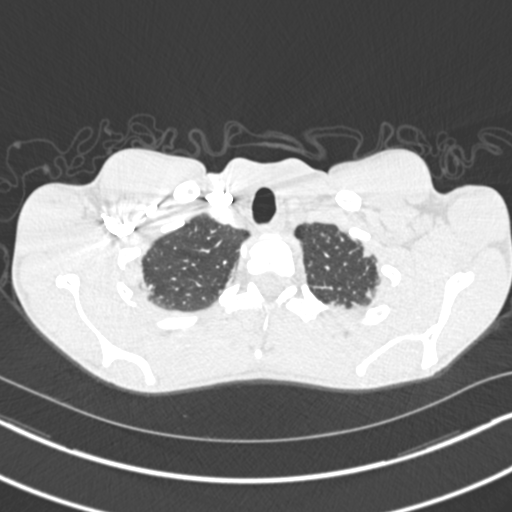
[im 330/345  soft-tissue]
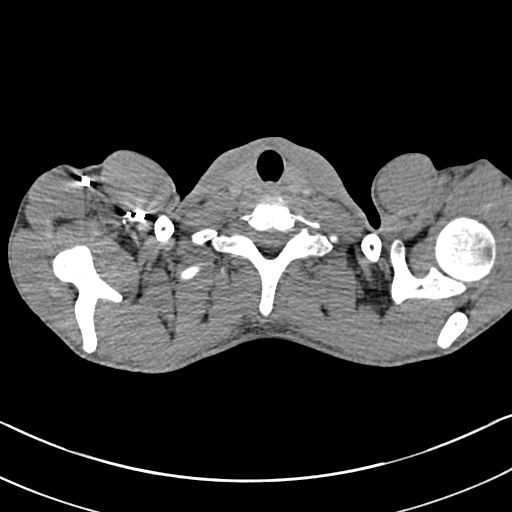

[Series 7: coronal mpr · coronal · 0.62mm/px · 2 of 71 slices shown]
[im 24/71  soft-tissue]
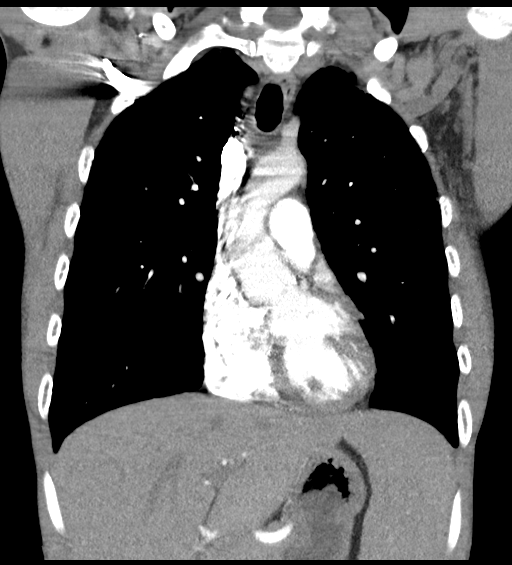
[im 47/71  soft-tissue]
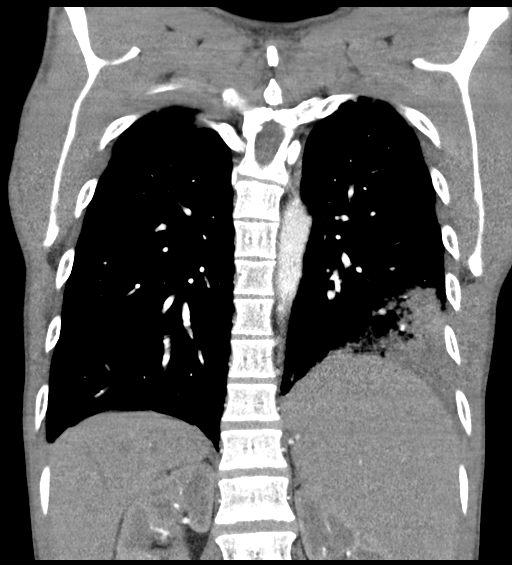

[18 of 46 positions shown; findings below may reference images not displayed]

FINDINGS: Cardiovascular: There is no demonstrable pulmonary embolus. There is
no thoracic aortic aneurysm or dissection. The visualized great
vessels appear unremarkable. No evident pericardial effusion or
pericardial thickening.

Mediastinum/Nodes: Thyroid appears unremarkable. No evident thoracic
adenopathy. No esophageal lesions are appreciable.

Lungs/Pleura: There is mild scarring in each apical region. There is
airspace consolidation consistent with pneumonia throughout much of
the left lower lobe, most severely involving the lateral and
posterior segment of regions of the left knee. There is prominent
with atelectatic change in the posterior right base. No pleural
effusions are evident.

Upper Abdomen: Splenomegaly evident although incompletely
visualized. Note that splenomegaly was noted on recent CT abdomen
study. There is a 2.3 x 1.0 cm cyst in the liver near the dome on
the right, stable. 5 mm cyst noted in the anterior segment right
lobe of the liver, stable. Visualized upper abdominal structures
otherwise appear unremarkable.

Musculoskeletal: There are no blastic or lytic bone lesions. No
chest wall lesions evident.

Review of the MIP images confirms the above findings.
IMPRESSION: 1. No demonstrable pulmonary embolus. No thoracic aortic aneurysm or
dissection.

2. Extensive airspace opacity consistent with pneumonia left lower
lobe, most severely involving lateral and posterior segments. There
is right base atelectasis posteriorly. There is scarring in each
apex region.

3.  No evident adenopathy.

4. Splenomegaly, incompletely visualized on this study also present
on recent CT examination. Per

## 2022-03-23 ENCOUNTER — Encounter (HOSPITAL_COMMUNITY): Payer: Self-pay | Admitting: Emergency Medicine

## 2022-03-23 ENCOUNTER — Emergency Department (HOSPITAL_COMMUNITY)
Admission: EM | Admit: 2022-03-23 | Discharge: 2022-03-23 | Discharge status: Against Medical Advice | Payer: BC Managed Care – PPO | Attending: Emergency Medicine | Admitting: Emergency Medicine

## 2022-03-23 ENCOUNTER — Other Ambulatory Visit: Payer: Self-pay

## 2022-03-23 ENCOUNTER — Emergency Department (HOSPITAL_COMMUNITY): Payer: BC Managed Care – PPO

## 2022-03-23 DIAGNOSIS — J101 Influenza due to other identified influenza virus with other respiratory manifestations: Secondary | ICD-10-CM | POA: Insufficient documentation

## 2022-03-23 DIAGNOSIS — Z1152 Encounter for screening for COVID-19: Secondary | ICD-10-CM | POA: Insufficient documentation

## 2022-03-23 DIAGNOSIS — Z5321 Procedure and treatment not carried out due to patient leaving prior to being seen by health care provider: Secondary | ICD-10-CM | POA: Insufficient documentation

## 2022-03-23 HISTORY — DX: Sickle-cell disease without crisis: D57.1

## 2022-03-23 LAB — CBC WITH DIFFERENTIAL/PLATELET
Abs Immature Granulocytes: 0.01 10*3/uL (ref 0.00–0.07)
Basophils Absolute: 0 10*3/uL (ref 0.0–0.1)
Basophils Relative: 1 %
Eosinophils Absolute: 0 10*3/uL (ref 0.0–0.5)
Eosinophils Relative: 0 %
HCT: 34.2 % — ABNORMAL LOW (ref 39.0–52.0)
Hemoglobin: 12.7 g/dL — ABNORMAL LOW (ref 13.0–17.0)
Immature Granulocytes: 0 %
Lymphocytes Relative: 14 %
Lymphs Abs: 0.7 10*3/uL (ref 0.7–4.0)
MCH: 30.7 pg (ref 26.0–34.0)
MCHC: 37.1 g/dL — ABNORMAL HIGH (ref 30.0–36.0)
MCV: 82.6 fL (ref 80.0–100.0)
Monocytes Absolute: 0.6 10*3/uL (ref 0.1–1.0)
Monocytes Relative: 13 %
Neutro Abs: 3.3 10*3/uL (ref 1.7–7.7)
Neutrophils Relative %: 72 %
Platelets: 83 10*3/uL — ABNORMAL LOW (ref 150–400)
RBC: 4.14 MIL/uL — ABNORMAL LOW (ref 4.22–5.81)
RDW: 12.7 % (ref 11.5–15.5)
WBC: 4.6 10*3/uL (ref 4.0–10.5)
nRBC: 0 % (ref 0.0–0.2)

## 2022-03-23 LAB — COMPREHENSIVE METABOLIC PANEL
ALT: 24 U/L (ref 0–44)
AST: 44 U/L — ABNORMAL HIGH (ref 15–41)
Albumin: 4.7 g/dL (ref 3.5–5.0)
Alkaline Phosphatase: 32 U/L — ABNORMAL LOW (ref 38–126)
Anion gap: 10 (ref 5–15)
BUN: 14 mg/dL (ref 6–20)
CO2: 26 mmol/L (ref 22–32)
Calcium: 9.5 mg/dL (ref 8.9–10.3)
Chloride: 100 mmol/L (ref 98–111)
Creatinine, Ser: 1.17 mg/dL (ref 0.61–1.24)
GFR, Estimated: >60 mL/min (ref >60)
Glucose, Bld: 115 mg/dL — ABNORMAL HIGH (ref 70–99)
Potassium: 3.9 mmol/L (ref 3.5–5.1)
Sodium: 136 mmol/L (ref 135–145)
Total Bilirubin: 2.6 mg/dL — ABNORMAL HIGH (ref 0.3–1.2)
Total Protein: 7.8 g/dL (ref 6.5–8.1)

## 2022-03-23 LAB — RETICULOCYTES
Immature Retic Fract: 4.5 % (ref 2.3–15.9)
RBC.: 4.13 MIL/uL — ABNORMAL LOW (ref 4.22–5.81)
Retic Count, Absolute: 46.3 10*3/uL (ref 19.0–186.0)
Retic Ct Pct: 1.1 % (ref 0.4–3.1)

## 2022-03-23 LAB — RESP PANEL BY RT-PCR (RSV, FLU A&B, COVID)  RVPGX2
Influenza A by PCR: POSITIVE — AB
Influenza B by PCR: NEGATIVE
Resp Syncytial Virus by PCR: NEGATIVE
SARS Coronavirus 2 by RT PCR: NEGATIVE

## 2022-03-23 MED ORDER — ACETAMINOPHEN 500 MG PO TABS
1000.0000 mg | ORAL_TABLET | Freq: Once | ORAL | Status: AC
  Administered 2022-03-23: 1000 mg via ORAL
  Filled 2022-03-23: qty 2

## 2022-03-23 MED ORDER — ONDANSETRON 4 MG PO TBDP
4.0000 mg | ORAL_TABLET | Freq: Once | ORAL | Status: AC
  Administered 2022-03-23: 4 mg via ORAL
  Filled 2022-03-23: qty 1

## 2022-03-23 MED ORDER — BISACODYL 5 MG PO TBEC
10.0000 mg | DELAYED_RELEASE_TABLET | Freq: Every day | ORAL | Status: DC | PRN
  Administered 2022-03-23: 10 mg via ORAL
  Filled 2022-03-23: qty 2

## 2022-03-23 NOTE — ED Provider Triage Note (Signed)
Emergency Medicine Provider Triage Evaluation Note  Troy Schwartz , a 41 y.o. male  was evaluated in triage.  Pt complains of nausea, vomiting, weakness, chills, shortness of breath, back pain, body aches around 4 days ago.  Patient endorses some constipation, increased thirst.  Patient with history of sickle cell C, reports no frequent crises unless really sick. Denies known sick contacts.  Review of Systems  Positive: Fever, chills, bodyaches, NV, shob, constipation Negative: Diarrhea, dysuria, abdominal pain  Physical Exam  BP 115/71 (BP Location: Right Arm)   Pulse (!) 103   Temp 100.2 F (37.9 C)   Resp 19   Ht 6\' 2"  (1.88 m)   Wt 70.3 kg   SpO2 100%   BMI 19.90 kg/m  Gen:   Awake, no distress   Resp:  Normal effort  MSK:   Moves extremities without difficulty  Other:  No significant tenderness palpation of the abdomen, clear breath sounds throughout  Medical Decision Making  Medically screening exam initiated at 11:06 AM.  Appropriate orders placed.  Erland Vivas was informed that the remainder of the evaluation will be completed by another provider, this initial triage assessment does not replace that evaluation, and the importance of remaining in the ED until their evaluation is complete.  Workup initiated   Lynnette Caffey, Olene Floss 03/23/22 1108

## 2022-03-23 NOTE — ED Triage Notes (Addendum)
Pt. Stated, I've had cough, N/V , back pain, chest pain , chills, this started 4 days ago. Im really thirsty also . I have not used the the bathroom in 4 days (BM)

## 2022-03-23 NOTE — ED Notes (Signed)
Pt stated they were going home when their name was called for vitals.

## 2022-03-24 ENCOUNTER — Telehealth: Payer: Self-pay

## 2022-03-24 NOTE — Telephone Encounter (Signed)
Transition Care Management Unsuccessful Follow-up Telephone Call  Date of discharge and from where:  12/28 [facility]  Attempts:  1st Attempt  Reason for unsuccessful TCM follow-up call:  Left voice message
